# Patient Record
Sex: Female | Born: 2000 | Hispanic: Yes | Marital: Single | Smoking: Current every day smoker
Health system: Southern US, Community
[De-identification: ages and names within clinical notes are randomized; demographics above are authoritative.]

## PROBLEM LIST (undated history)

## (undated) DIAGNOSIS — J45909 Unspecified asthma, uncomplicated: Secondary | ICD-10-CM

## (undated) DIAGNOSIS — L83 Acanthosis nigricans: Secondary | ICD-10-CM

## (undated) DIAGNOSIS — E669 Obesity, unspecified: Secondary | ICD-10-CM

## (undated) DIAGNOSIS — G43909 Migraine, unspecified, not intractable, without status migrainosus: Secondary | ICD-10-CM

## (undated) DIAGNOSIS — T7840XA Allergy, unspecified, initial encounter: Secondary | ICD-10-CM

## (undated) DIAGNOSIS — J353 Hypertrophy of tonsils with hypertrophy of adenoids: Secondary | ICD-10-CM

---
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
## 2019-01-14 IMAGING — CT CT ABD-PELV W/ CM
2 of 4 series · 16 of 46 positions shown, 18 images · IV contrast (ISOVUE)
Comparison: None.

CLINICAL DATA: Abdominal pain and nausea

EXAM:
CT ABDOMEN AND PELVIS WITH CONTRAST
TECHNIQUE: Multidetector CT imaging of the abdomen and pelvis was performed
using the standard protocol following bolus administration of
intravenous contrast.
CONTRAST:  100mL [id number] IOPAMIDOL ([id number]) INJECTION 61%

[Series 2: axial st · axial · 0.75mm/px · z∈[+1080,+1480]mm · 13 of 92 slices shown, 15 images]
[im 6/92  soft-tissue]
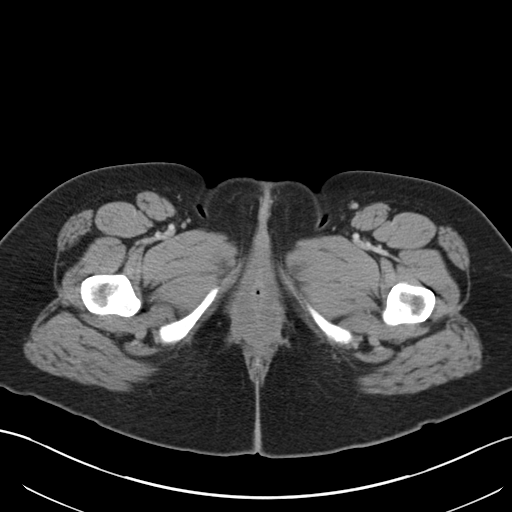
[im 6/92  bone]
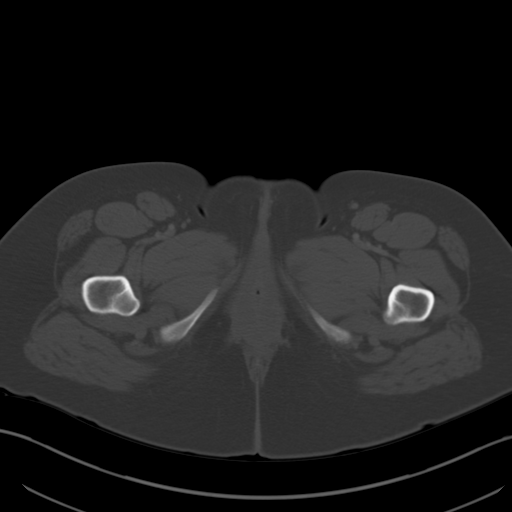
[im 11/92  soft-tissue]
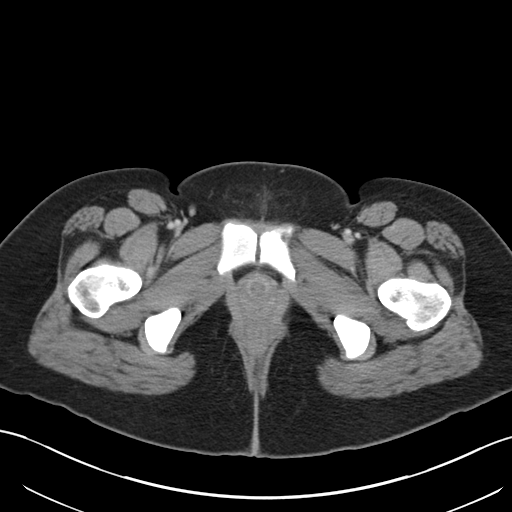
[im 21/92  soft-tissue]
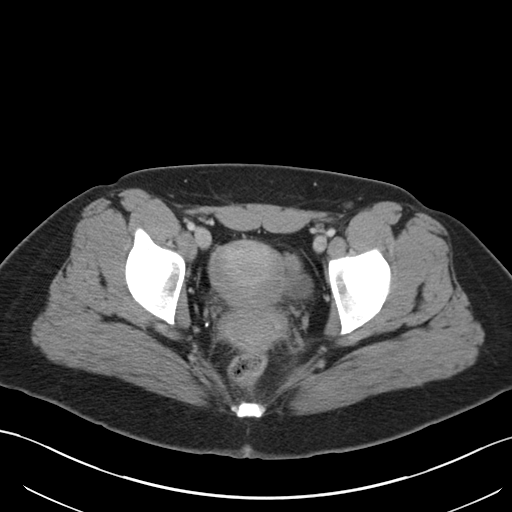
[im 26/92  soft-tissue]
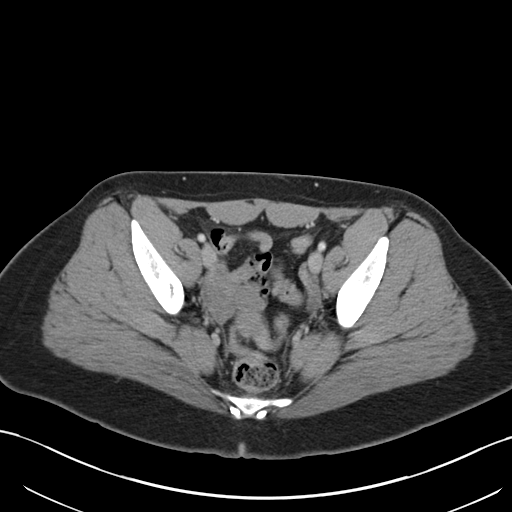
[im 31/92  soft-tissue]
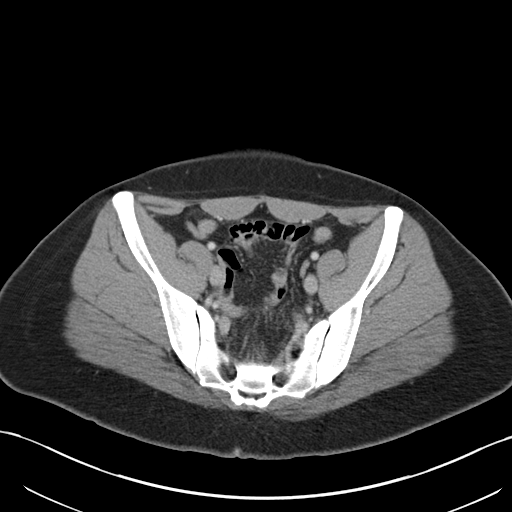
[im 41/92  soft-tissue]
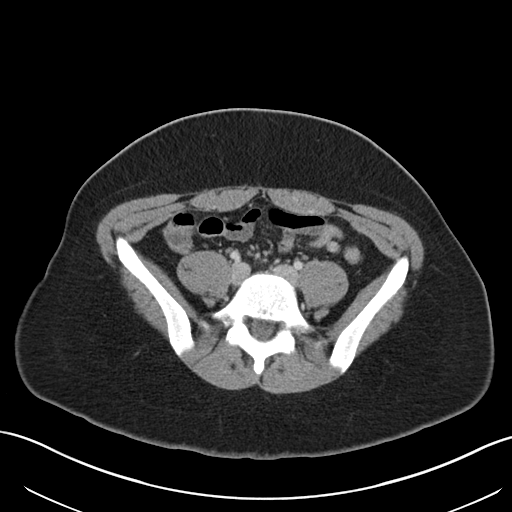
[im 46/92  soft-tissue]
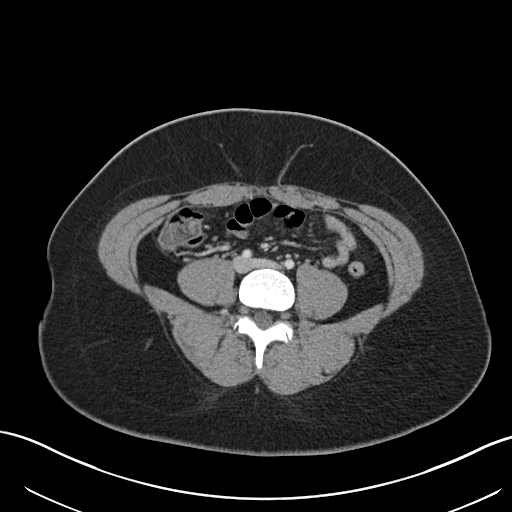
[im 51/92  soft-tissue]
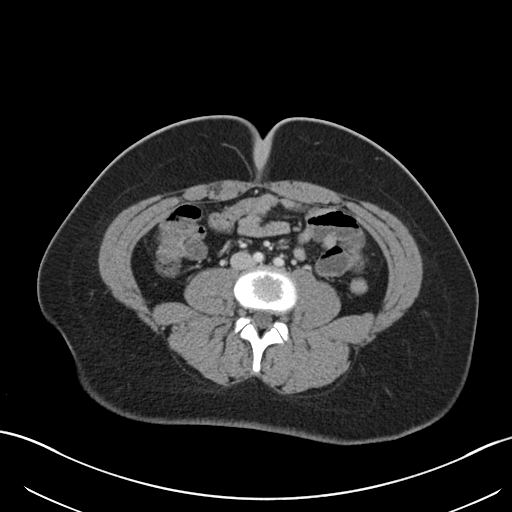
[im 61/92  soft-tissue]
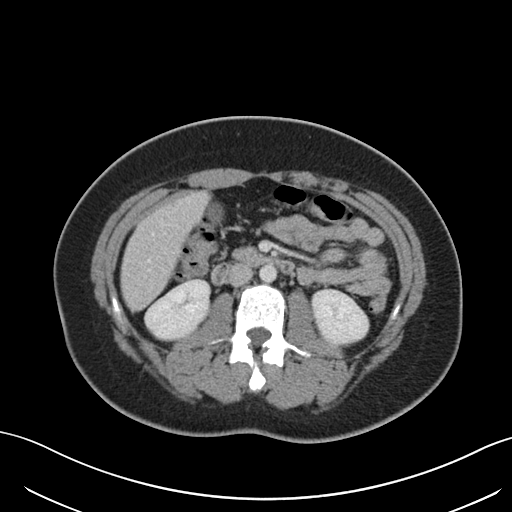
[im 61/92  bone]
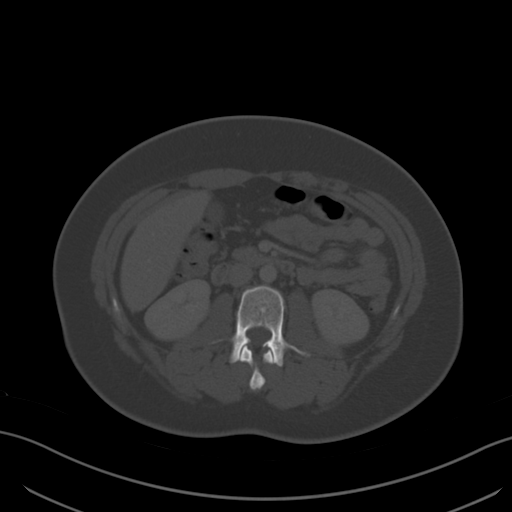
[im 66/92  soft-tissue]
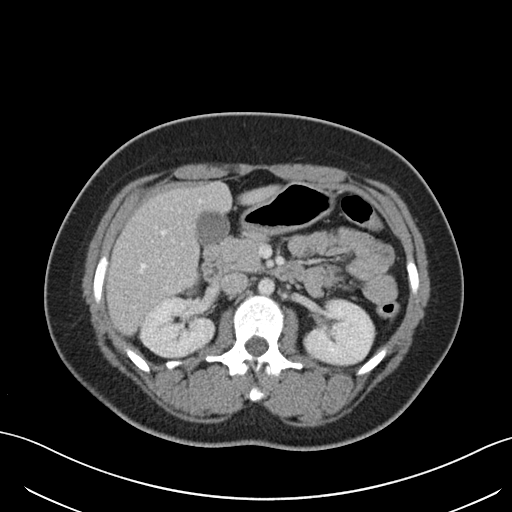
[im 71/92  soft-tissue]
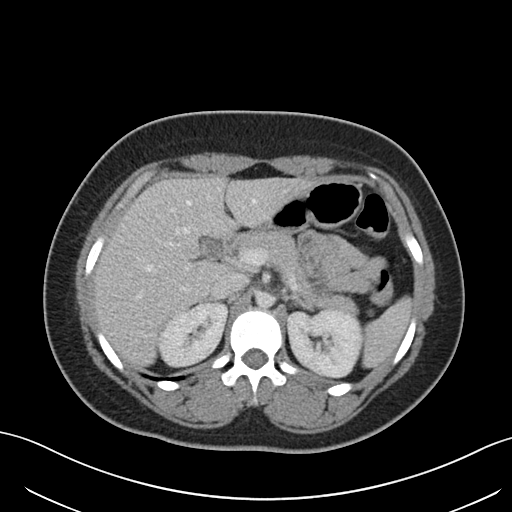
[im 81/92  soft-tissue]
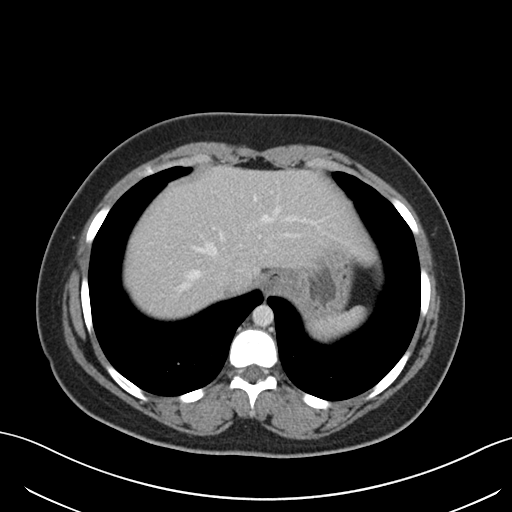
[im 86/92  soft-tissue]
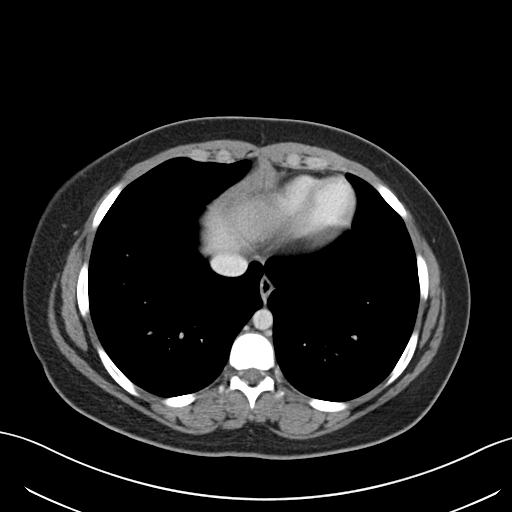

[Series 5: coronal st · coronal · 0.72mm/px · 3 of 90 slices shown]
[im 30/90  soft-tissue]
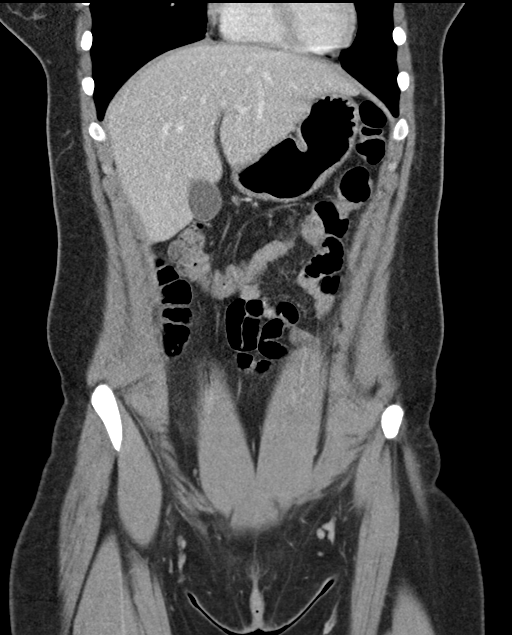
[im 40/90  soft-tissue]
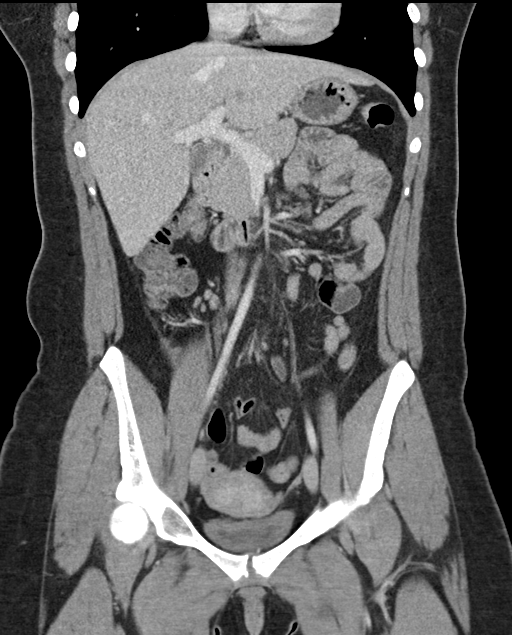
[im 50/90  soft-tissue]
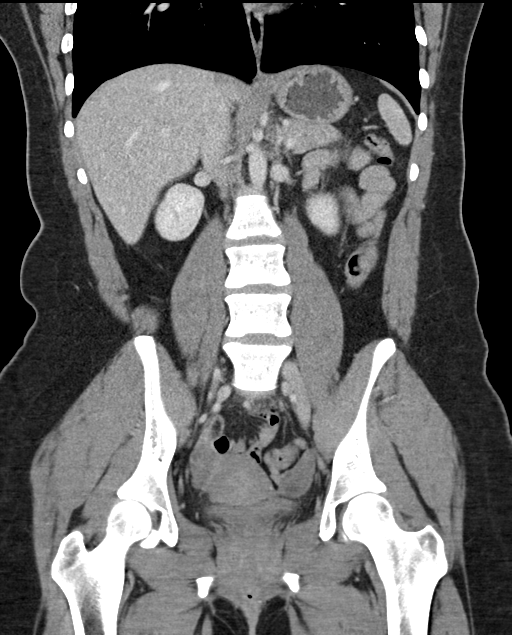

[16 of 46 positions shown; findings below may reference images not displayed]

FINDINGS: Lower chest: No basilar pulmonary nodules or pleural effusion. No
apical pericardial effusion.

Hepatobiliary: Normal hepatic contours and density. No visible
biliary dilatation. Normal gallbladder.

Pancreas: Normal parenchymal contours without ductal dilatation. No
peripancreatic fluid collection.

Spleen: Normal.

Adrenals/Urinary Tract:

--Adrenal glands: Normal.

--Right kidney/ureter: No hydronephrosis, perinephric stranding or
nephrolithiasis. No obstructing ureteral stones.

--Left kidney/ureter: No hydronephrosis, perinephric stranding or
nephrolithiasis. No obstructing ureteral stones.

--Urinary bladder: Normal appearance for the degree of distention.

Stomach/Bowel:

--Stomach/Duodenum: No hiatal hernia or other gastric abnormality.
Normal duodenal course.

--Small bowel: No dilatation or inflammation.

--Colon: No focal abnormality.

--Appendix: Normal.

Vascular/Lymphatic: Normal course and caliber of the major abdominal
vessels. No abdominal or pelvic lymphadenopathy.

Reproductive: No free fluid in the pelvis.

Musculoskeletal. No bony spinal canal stenosis or focal osseous
abnormality.

Other: None.
IMPRESSION: No acute abdominal or pelvic abnormality.

## 2019-06-27 ENCOUNTER — Other Ambulatory Visit: Payer: Self-pay

## 2019-06-27 ENCOUNTER — Emergency Department (HOSPITAL_COMMUNITY)
Admission: EM | Admit: 2019-06-27 | Discharge: 2019-06-27 | Disposition: A | Discharge status: Home / Self Care | Payer: Self-pay | Attending: Emergency Medicine | Admitting: Emergency Medicine

## 2019-06-27 ENCOUNTER — Emergency Department (HOSPITAL_COMMUNITY): Payer: Self-pay

## 2019-06-27 DIAGNOSIS — J4541 Moderate persistent asthma with (acute) exacerbation: Secondary | ICD-10-CM | POA: Insufficient documentation

## 2019-06-27 DIAGNOSIS — Z793 Long term (current) use of hormonal contraceptives: Secondary | ICD-10-CM | POA: Insufficient documentation

## 2019-06-27 DIAGNOSIS — F17210 Nicotine dependence, cigarettes, uncomplicated: Secondary | ICD-10-CM | POA: Insufficient documentation

## 2019-06-27 LAB — I-STAT BETA HCG BLOOD, ED (MC, WL, AP ONLY): I-stat hCG, quantitative: <5 m[IU]/mL (ref <5)

## 2019-06-27 LAB — CBC WITH DIFFERENTIAL/PLATELET
Abs Immature Granulocytes: 0.04 10*3/uL (ref 0.00–0.07)
Basophils Absolute: 0 10*3/uL (ref 0.0–0.1)
Basophils Relative: 0 %
Eosinophils Absolute: 0.2 10*3/uL (ref 0.0–0.5)
Eosinophils Relative: 2 %
HCT: 41.2 % (ref 36.0–46.0)
Hemoglobin: 14.4 g/dL (ref 12.0–15.0)
Immature Granulocytes: 0 %
Lymphocytes Relative: 40 %
Lymphs Abs: 3.9 10*3/uL (ref 0.7–4.0)
MCH: 27.6 pg (ref 26.0–34.0)
MCHC: 35 g/dL (ref 30.0–36.0)
MCV: 79.1 fL — ABNORMAL LOW (ref 80.0–100.0)
Monocytes Absolute: 0.7 10*3/uL (ref 0.1–1.0)
Monocytes Relative: 8 %
Neutro Abs: 4.8 10*3/uL (ref 1.7–7.7)
Neutrophils Relative %: 50 %
Platelets: 433 10*3/uL — ABNORMAL HIGH (ref 150–400)
RBC: 5.21 MIL/uL — ABNORMAL HIGH (ref 3.87–5.11)
RDW: 14 % (ref 11.5–15.5)
WBC: 9.6 10*3/uL (ref 4.0–10.5)
nRBC: 0 % (ref 0.0–0.2)

## 2019-06-27 LAB — BASIC METABOLIC PANEL
Anion gap: 10 (ref 5–15)
BUN: 12 mg/dL (ref 6–20)
CO2: 21 mmol/L — ABNORMAL LOW (ref 22–32)
Calcium: 9.4 mg/dL (ref 8.9–10.3)
Chloride: 108 mmol/L (ref 98–111)
Creatinine, Ser: 0.93 mg/dL (ref 0.44–1.00)
GFR calc Af Amer: >60 mL/min (ref >60)
GFR calc non Af Amer: >60 mL/min (ref >60)
Glucose, Bld: 119 mg/dL — ABNORMAL HIGH (ref 70–99)
Potassium: 3 mmol/L — ABNORMAL LOW (ref 3.5–5.1)
Sodium: 139 mmol/L (ref 135–145)

## 2019-06-27 MED ORDER — ALBUTEROL SULFATE HFA 108 (90 BASE) MCG/ACT IN AERS
6.0000 | INHALATION_SPRAY | Freq: Once | RESPIRATORY_TRACT | Status: AC
  Administered 2019-06-27: 09:00:00 2 via RESPIRATORY_TRACT
  Filled 2019-06-27: qty 6.7

## 2019-06-27 MED ORDER — PREDNISONE 10 MG PO TABS: 50.0000 mg | ORAL_TABLET | Freq: Every day | ORAL | 0 refills | Status: DC

## 2019-06-27 MED ORDER — ALBUTEROL SULFATE (2.5 MG/3ML) 0.083% IN NEBU
2.5000 mg | INHALATION_SOLUTION | Freq: Once | RESPIRATORY_TRACT | Status: AC
  Administered 2019-06-27: 09:00:00 2.5 mg via RESPIRATORY_TRACT
  Filled 2019-06-27: qty 3

## 2019-06-27 MED ORDER — ALBUTEROL SULFATE HFA 108 (90 BASE) MCG/ACT IN AERS
6.0000 | INHALATION_SPRAY | Freq: Once | RESPIRATORY_TRACT | Status: AC
  Administered 2019-06-27: 08:00:00 6 via RESPIRATORY_TRACT

## 2019-06-27 MED ORDER — LORAZEPAM 1 MG PO TABS
1.0000 mg | ORAL_TABLET | Freq: Once | ORAL | Status: AC
  Administered 2019-06-27: 1 mg via ORAL
  Filled 2019-06-27: qty 1

## 2019-06-27 MED ORDER — MAGNESIUM SULFATE 2 GM/50ML IV SOLN
2.0000 g | Freq: Once | INTRAVENOUS | Status: AC
  Administered 2019-06-27: 11:00:00 2 g via INTRAVENOUS
  Filled 2019-06-27: qty 50

## 2019-06-27 MED ORDER — ALBUTEROL SULFATE HFA 108 (90 BASE) MCG/ACT IN AERS
INHALATION_SPRAY | RESPIRATORY_TRACT | Status: AC
  Administered 2019-06-27: 6 via RESPIRATORY_TRACT
  Filled 2019-06-27: qty 6.7

## 2019-06-27 MED ORDER — POTASSIUM CHLORIDE CRYS ER 20 MEQ PO TBCR
40.0000 meq | EXTENDED_RELEASE_TABLET | Freq: Once | ORAL | Status: AC
  Administered 2019-06-27: 12:00:00 40 meq via ORAL
  Filled 2019-06-27: qty 2

## 2019-06-27 MED ORDER — ALBUTEROL SULFATE (2.5 MG/3ML) 0.083% IN NEBU
5.0000 mg | INHALATION_SOLUTION | RESPIRATORY_TRACT | Status: AC
  Administered 2019-06-27 (×2): 5 mg via RESPIRATORY_TRACT
  Filled 2019-06-27: qty 6

## 2019-06-27 MED ORDER — METHYLPREDNISOLONE SODIUM SUCC 125 MG IJ SOLR
125.0000 mg | Freq: Once | INTRAMUSCULAR | Status: AC
  Administered 2019-06-27: 11:00:00 125 mg via INTRAVENOUS
  Filled 2019-06-27: qty 2

## 2019-06-27 NOTE — ED Notes (Signed)
Provider at bedside requested wait for blood work and IV insertion.

## 2019-06-27 NOTE — ED Notes (Signed)
Pt verbalizes understanding of DC instructions. Belongings returned and ambulatory out of ED

## 2019-06-27 NOTE — ED Provider Notes (Signed)
[facility] DEPT Provider Note   CSN: [id number] Arrival date & time: 06/27/19  0865     History   Chief Complaint No chief complaint on file.   HPI Regina Lynn is a 18 y.o. female.     HPI  18 year old female with history of asthma comes in a chief complaint of shortness of breath. Patient reports that she has been having some shortness of breath over the past few days.  Today she woke up feeling like she could not breathe at all, and came to the ER immediately.  She reports that she has been taking albuterol treatment off and on with transient relief.  She has a cough, chest tightness.  She denies any fevers or chills, but states that she has had some body aches and feeling generalized weakness.  Patient smokes.  She denies any substance abuse besides marijuana use.  There is no history of heavy alcohol use.  Patient has not required admission to the hospital because of asthma.  She does indicate that during this time the year she typically gets asthma exacerbation because of weather change.  Pt has no hx of PE, DVT and denies any long distance travels or surgery in the past 6 weeks, active cancer, recent immobilization. She does use nexplanon implant.  Past Medical History:  Diagnosis Date  . Acanthosis nigricans   . Allergy   . Hypertrophy of tonsils and adenoids 11/2013   snores during sleep, mother denies apnea  . Migraines   . Obesity     There are no active problems to display for this patient.   Past Surgical History:  Procedure Laterality Date  . TONSILLECTOMY AND ADENOIDECTOMY Bilateral 12/19/2013   Procedure: BILATERAL TONSILLECTOMY AND ADENOIDECTOMY;  Surgeon: Ascencion Dike, MD;  Location: [place] [facility];  Service: ENT;  Laterality: Bilateral;     OB History   No obstetric history on file.      Home Medications    Prior to Admission medications   Medication Sig Start Date End Date Taking? Authorizing  Provider  albuterol (VENTOLIN HFA) 108 (90 Base) MCG/ACT inhaler Inhale 2 puffs into the lungs every 6 (six) hours as needed for wheezing or shortness of breath.   Yes [provider]  etonogestrel (NEXPLANON) 68 MG IMPL implant 1 each by Subdermal route once.   Yes [provider]  ibuprofen (ADVIL) 200 MG tablet Take 200 mg by mouth every 6 (six) hours as needed for fever, headache or moderate pain.   Yes [provider]  sodium chloride (OCEAN) 0.65 % SOLN nasal spray Place 1 spray into both nostrils as needed for congestion.   Yes [provider]  ondansetron (ZOFRAN) 4 MG tablet Take 1 tablet (4 mg total) by mouth every 8 (eight) hours as needed for nausea or vomiting. Patient not taking: Reported on 06/27/2019 12/03/17   Jillyn Ledger, PA-C    Family History Family History  Problem Relation Age of Onset  . Hypertension Father   . Asthma Father   . Hypertension Paternal Grandfather   . Asthma Paternal Grandfather     Social History Social History   Tobacco Use  . Smoking status: Current Every Day Smoker    Packs/day: 0.50    Types: Cigarettes  . Smokeless tobacco: Never Used  . Tobacco comment: mother smokes outside  Substance Use Topics  . Alcohol use: Yes    Comment: occasionally   . Drug use: Yes  Types: Marijuana    Comment: "molly"     Allergies   Patient has no known allergies.   Review of Systems Review of Systems  Constitutional: Positive for activity change.  Respiratory: Positive for cough and shortness of breath.   Cardiovascular: Positive for chest pain.  Gastrointestinal: Negative for nausea and vomiting.  Allergic/Immunologic: Negative for immunocompromised state.  Hematological: Does not bruise/bleed easily.  All other systems reviewed and are negative.    Physical Exam Updated Vital Signs BP 121/83   Pulse 60   Temp (!) 97.5 F (36.4 C) (Oral)   Resp 16   Ht 5\' 2"  (1.575 m)   Wt 77.1 kg   LMP  06/25/2019 (Exact Date)   SpO2 99%   BMI 31.09 kg/m   Physical Exam Vitals signs and nursing note reviewed.  Constitutional:      Appearance: She is well-developed.  HENT:     Head: Normocephalic and atraumatic.  Neck:     Musculoskeletal: Normal range of motion and neck supple.  Cardiovascular:     Rate and Rhythm: Normal rate.  Pulmonary:     Breath sounds: No rhonchi or rales.     Comments: Tachypnea, no hypoxia or respiratory distress. Patient has mild wheezing and diminished breath sounds diffusely Abdominal:     General: Bowel sounds are normal.  Skin:    General: Skin is warm and dry.  Neurological:     Mental Status: She is alert and oriented to person, place, and time.      ED Treatments / Results  Labs (all labs ordered are listed, but only abnormal results are displayed) Labs Reviewed  BASIC METABOLIC PANEL  CBC WITH DIFFERENTIAL/PLATELET  I-STAT BETA HCG BLOOD, ED ([facility], [facility], [facility] ONLY)    EKG EKG Interpretation  Date/Time:  Monday June 27 2019 07:39:09 EDT Ventricular Rate:  69 PR Interval:    QRS Duration: 85 QT Interval:  369 QTC Calculation: 396 R Axis:   80 Text Interpretation:  Sinus rhythm Probable left atrial enlargement No acute changes No significant change since last tracing Confirmed by Derwood Kaplananavati, Sussie Minor ([id number]) on 06/27/2019 8:43:11 AM   Radiology Dg Chest Port 1 View  Result Date: 06/27/2019 CLINICAL DATA:  Asthma.  Shortness of breath. EXAM: PORTABLE CHEST 1 VIEW COMPARISON:  December 02, 2017 FINDINGS: The heart size and mediastinal contours are within normal limits. Both lungs are clear. The visualized skeletal structures are unremarkable. IMPRESSION: No active disease. Electronically Signed   By: Gerome Samavid  Williams III M.D   On: 06/27/2019 08:09    Procedures .Critical Care Performed by: Derwood KaplanNanavati, Amanie Mcculley, MD Authorized by: Derwood KaplanNanavati, Janyla Biscoe, MD   Critical care provider statement:    Critical care time (minutes):  45   Critical care was  necessary to treat or prevent imminent or life-threatening deterioration of the following conditions:  Respiratory failure   Critical care was time spent personally by me on the following activities:  Discussions with consultants, evaluation of patient's response to treatment, examination of patient, ordering and performing treatments and interventions, ordering and review of laboratory studies, ordering and review of radiographic studies, pulse oximetry, re-evaluation of patient's condition, obtaining history from patient or surrogate and review of old charts   Smoking cessation instruction/counseling given:  counseled patient on the dangers of tobacco use, advised patient to stop smoking, and reviewed strategies to maximize success.  Advised to 800-quitnow. Discussed 3-5 min.   (including critical care time)  Medications Ordered in ED Medications  magnesium sulfate IVPB  2 g 50 mL (has no administration in time range)  albuterol (PROVENTIL) (2.5 MG/3ML) 0.083% nebulizer solution 5 mg (has no administration in time range)  methylPREDNISolone sodium succinate (SOLU-MEDROL) 125 mg/2 mL injection 125 mg (has no administration in time range)  albuterol (PROVENTIL) (2.5 MG/3ML) 0.083% nebulizer solution 2.5 mg (2.5 mg Nebulization Given 06/27/19 0901)  albuterol (VENTOLIN HFA) 108 (90 Base) MCG/ACT inhaler 6 puff (6 puffs Inhalation Given 06/27/19 0742)  albuterol (VENTOLIN HFA) 108 (90 Base) MCG/ACT inhaler 6 puff (2 puffs Inhalation Given 06/27/19 0857)  LORazepam (ATIVAN) tablet 1 mg (1 mg Oral Given 06/27/19 0856)     Initial Impression / Assessment and Plan / ED Course  I have reviewed the triage vital signs and the nursing notes.  Pertinent labs & imaging results that were available during my care of the patient were reviewed by me and considered in my medical decision making (see chart for details).  Clinical Course as of Jun 27 943  Mon Jun 27, 2019  0800 Patient reassessed after she  received multiple inhaler treatments.  She informs me that she feels better.  We will avoid any labs at this time.  X-ray pending.   [AN]  0930 Patient reassessed.  She reports that despite getting treatment, her respirations have gotten worse.  We will give her IV Solu-Medrol, magnesium and more breathing treatment and reassess.  RT performed a peak flow for [place] and informed me that her peak flow was only 160.  She still not in any acute respiratory distress or hypoxia.  We will reassess and decide if she needs admission.   [AN]    Clinical Course User Index [AN] Derwood Kaplan, MD       18 year old comes in a chief complaint of shortness of breath and difficulty in breathing. She reports history of asthma.  Patient has mild wheezing, diminished breath sounds and clinical concerns are that she is having asthma exacerbation.  She reports feeling more anxious as well therefore we will give her Ativan along with nebulizer treatment.    Final Clinical Impressions(s) / ED Diagnoses   Final diagnoses:  None    ED Discharge Orders    None       Derwood Kaplan, MD 06/27/19 [id number]

## 2019-06-27 NOTE — ED Notes (Signed)
Pt ambulated around room with O2 sats remaining above 95%

## 2019-06-27 NOTE — Discharge Instructions (Addendum)
We saw you in the ER for your asthma related complains. We gave you some breathing treatments in the ER, and seems like your symptoms have improved. Please take albuterol as needed every 4 hours. Please take the medications prescribed. Please refrain from smoking or smoke exposure. Please see a primary care doctor in 1 week. Return to the ER if your symptoms worsen.  

## 2019-06-27 NOTE — ED Notes (Signed)
Respiratory at bedside for nebulizer treatment

## 2019-06-27 NOTE — Progress Notes (Signed)
Peak flow = 160.

## 2019-09-05 ENCOUNTER — Emergency Department (HOSPITAL_COMMUNITY)
Admission: EM | Admit: 2019-09-05 | Discharge: 2019-09-05 | Discharge status: Against Medical Advice | Payer: Medicaid Other | Attending: Emergency Medicine | Admitting: Emergency Medicine

## 2019-09-05 ENCOUNTER — Encounter (HOSPITAL_COMMUNITY): Payer: Self-pay | Admitting: Emergency Medicine

## 2019-09-05 ENCOUNTER — Other Ambulatory Visit: Payer: Self-pay

## 2019-09-05 DIAGNOSIS — Z5321 Procedure and treatment not carried out due to patient leaving prior to being seen by health care provider: Secondary | ICD-10-CM | POA: Insufficient documentation

## 2019-09-05 DIAGNOSIS — M79651 Pain in right thigh: Secondary | ICD-10-CM | POA: Insufficient documentation

## 2019-09-05 NOTE — ED Triage Notes (Signed)
Pt has ingrown hair in her right upper thigh she has had lanced before. Pt report has been ongoing for 5 days

## 2019-09-06 NOTE — ED Provider Notes (Signed)
Patient left without being seen  1. Patient left without being seen       Deno Etienne, DO 09/06/19 1538

## 2020-03-08 ENCOUNTER — Emergency Department (HOSPITAL_COMMUNITY): Payer: Self-pay

## 2020-03-08 ENCOUNTER — Encounter (HOSPITAL_COMMUNITY): Payer: Self-pay | Admitting: Emergency Medicine

## 2020-03-08 ENCOUNTER — Emergency Department (HOSPITAL_COMMUNITY)
Admission: EM | Admit: 2020-03-08 | Discharge: 2020-03-08 | Disposition: A | Discharge status: Home / Self Care | Payer: Self-pay | Attending: Emergency Medicine | Admitting: Emergency Medicine

## 2020-03-08 ENCOUNTER — Other Ambulatory Visit: Payer: Self-pay

## 2020-03-08 DIAGNOSIS — R1115 Cyclical vomiting syndrome unrelated to migraine: Secondary | ICD-10-CM | POA: Insufficient documentation

## 2020-03-08 DIAGNOSIS — F17210 Nicotine dependence, cigarettes, uncomplicated: Secondary | ICD-10-CM | POA: Insufficient documentation

## 2020-03-08 DIAGNOSIS — R1084 Generalized abdominal pain: Secondary | ICD-10-CM | POA: Insufficient documentation

## 2020-03-08 DIAGNOSIS — R0789 Other chest pain: Secondary | ICD-10-CM | POA: Insufficient documentation

## 2020-03-08 DIAGNOSIS — R519 Headache, unspecified: Secondary | ICD-10-CM | POA: Insufficient documentation

## 2020-03-08 DIAGNOSIS — F1290 Cannabis use, unspecified, uncomplicated: Secondary | ICD-10-CM | POA: Insufficient documentation

## 2020-03-08 HISTORY — DX: Unspecified asthma, uncomplicated: J45.909

## 2020-03-08 LAB — CBC
HCT: 41.9 % (ref 36.0–46.0)
Hemoglobin: 14.5 g/dL (ref 12.0–15.0)
MCH: 27.5 pg (ref 26.0–34.0)
MCHC: 34.6 g/dL (ref 30.0–36.0)
MCV: 79.4 fL — ABNORMAL LOW (ref 80.0–100.0)
Platelets: 418 10*3/uL — ABNORMAL HIGH (ref 150–400)
RBC: 5.28 MIL/uL — ABNORMAL HIGH (ref 3.87–5.11)
RDW: 14 % (ref 11.5–15.5)
WBC: 7 10*3/uL (ref 4.0–10.5)
nRBC: 0 % (ref 0.0–0.2)

## 2020-03-08 LAB — COMPREHENSIVE METABOLIC PANEL
ALT: 18 U/L (ref 0–44)
AST: 15 U/L (ref 15–41)
Albumin: 4.6 g/dL (ref 3.5–5.0)
Alkaline Phosphatase: 28 U/L — ABNORMAL LOW (ref 38–126)
Anion gap: 10 (ref 5–15)
BUN: 9 mg/dL (ref 6–20)
CO2: 23 mmol/L (ref 22–32)
Calcium: 9 mg/dL (ref 8.9–10.3)
Chloride: 106 mmol/L (ref 98–111)
Creatinine, Ser: 0.84 mg/dL (ref 0.44–1.00)
GFR calc Af Amer: >60 mL/min (ref >60)
GFR calc non Af Amer: >60 mL/min (ref >60)
Glucose, Bld: 100 mg/dL — ABNORMAL HIGH (ref 70–99)
Potassium: 3.9 mmol/L (ref 3.5–5.1)
Sodium: 139 mmol/L (ref 135–145)
Total Bilirubin: 0.6 mg/dL (ref 0.3–1.2)
Total Protein: 7.7 g/dL (ref 6.5–8.1)

## 2020-03-08 LAB — URINALYSIS, ROUTINE W REFLEX MICROSCOPIC
Bilirubin Urine: NEGATIVE
Glucose, UA: NEGATIVE mg/dL
Ketones, ur: NEGATIVE mg/dL
Leukocytes,Ua: NEGATIVE
Nitrite: NEGATIVE
Protein, ur: NEGATIVE mg/dL
Specific Gravity, Urine: 1.012 (ref 1.005–1.030)
pH: 9 — ABNORMAL HIGH (ref 5.0–8.0)

## 2020-03-08 LAB — LIPASE, BLOOD: Lipase: 21 U/L (ref 11–51)

## 2020-03-08 LAB — I-STAT BETA HCG BLOOD, ED (MC, WL, AP ONLY): I-stat hCG, quantitative: <5 m[IU]/mL (ref <5)

## 2020-03-08 MED ORDER — ONDANSETRON 4 MG PO TBDP
4.0000 mg | ORAL_TABLET | Freq: Three times a day (TID) | ORAL | 0 refills | Status: DC | PRN
Start: 2020-03-08 — End: 2024-01-09

## 2020-03-08 MED ORDER — LACTATED RINGERS IV BOLUS
1000.0000 mL | Freq: Once | INTRAVENOUS | Status: AC
  Administered 2020-03-08: 1000 mL via INTRAVENOUS

## 2020-03-08 MED ORDER — METOCLOPRAMIDE HCL 5 MG/ML IJ SOLN
10.0000 mg | Freq: Once | INTRAMUSCULAR | Status: AC
  Administered 2020-03-08: 10 mg via INTRAVENOUS
  Filled 2020-03-08: qty 2

## 2020-03-08 MED ORDER — SODIUM CHLORIDE 0.9% FLUSH
3.0000 mL | Freq: Once | INTRAVENOUS | Status: AC
  Administered 2020-03-08: 3 mL via INTRAVENOUS

## 2020-03-08 MED ORDER — PANTOPRAZOLE SODIUM 40 MG PO TBEC
40.0000 mg | DELAYED_RELEASE_TABLET | Freq: Every day | ORAL | 0 refills | Status: DC
Start: 2020-03-08 — End: 2024-01-09

## 2020-03-08 NOTE — Discharge Instructions (Addendum)
If you develop worsening, continued, or recurrent abdominal pain, uncontrolled vomiting, fever, chest or back pain, or any other new/concerning symptoms then return to the ER for evaluation.   Stop using marijuana, as this may be causing your vomiting cycles. It is best to stop completely

## 2020-03-08 NOTE — ED Notes (Signed)
X-ray at bedside

## 2020-03-08 NOTE — ED Notes (Signed)
Pt requesting to have the IV removed from her arm at this time stating that the IV hurts. IV patent, IV flushes and pulls back. IV removed. MD aware.

## 2020-03-08 NOTE — ED Triage Notes (Signed)
Pt c/o abd pains with vomiting, headache, SOB-think asthma or anxiety. Pt used her dad's neb treatment this morning.

## 2020-03-08 NOTE — ED Provider Notes (Signed)
[facility]-EMERGENCY DEPT Provider Note   CSN: [id number] Arrival date & time: 03/08/20  0756     History Chief Complaint  Patient presents with  . Emesis  . Abdominal Pain  . Shortness of Breath    Regina Lynn is a 19 y.o. female.  HPI 19 year old female presents with a chief complaint of vomiting.  Started around 4 AM.  Has had about 3 episodes of nonbloody emesis.  After the vomiting started she then developed abdominal pain, then chest pain, and then headache.  The symptoms are similar to many prior episodes.  States she has been told it was from marijuana in the past but has had no further testing done.  Smokes daily.  Her symptoms are improving since original onset but she still has upper abdominal pain and nausea.  Brief shortness of breath that she thinks might have been from anxiety.  No cough.  No diarrhea.  No urinary or vaginal symptoms.   Past Medical History:  Diagnosis Date  . Acanthosis nigricans   . Allergy   . Asthma   . Hypertrophy of tonsils and adenoids 11/2013   snores during sleep, mother denies apnea  . Migraines   . Obesity     There are no problems to display for this patient.   Past Surgical History:  Procedure Laterality Date  . TONSILLECTOMY AND ADENOIDECTOMY Bilateral 12/19/2013   Procedure: BILATERAL TONSILLECTOMY AND ADENOIDECTOMY;  Surgeon: Darletta Moll, MD;  Location: [facility];  Service: ENT;  Laterality: Bilateral;     OB History   No obstetric history on file.     Family History  Problem Relation Age of Onset  . Hypertension Father   . Asthma Father   . Hypertension Paternal Grandfather   . Asthma Paternal Grandfather     Social History   Tobacco Use  . Smoking status: Current Every Day Smoker    Packs/day: 0.50    Types: Cigarettes  . Smokeless tobacco: Never Used  . Tobacco comment: mother smokes outside  Substance Use Topics  . Alcohol use: Yes    Comment: occasionally   .  Drug use: Yes    Types: Marijuana    Comment: "molly"    Home Medications Prior to Admission medications   Medication Sig Start Date End Date Taking? Authorizing Provider  etonogestrel (NEXPLANON) 68 MG IMPL implant 1 each by Subdermal route once.   Yes [provider]  fluticasone (FLONASE) 50 MCG/ACT nasal spray Place 2 sprays into both nostrils daily.   Yes [provider]  ibuprofen (ADVIL) 200 MG tablet Take 200 mg by mouth every 6 (six) hours as needed for fever, headache or moderate pain.   Yes [provider]  ondansetron (ZOFRAN ODT) 4 MG disintegrating tablet Take 1 tablet (4 mg total) by mouth every 8 (eight) hours as needed for nausea or vomiting. 03/08/20   Pricilla Loveless, MD  pantoprazole (PROTONIX) 40 MG tablet Take 1 tablet (40 mg total) by mouth daily. 03/08/20   Pricilla Loveless, MD    Allergies    Patient has no known allergies.  Review of Systems   Review of Systems  Constitutional: Negative for fever.  Respiratory: Negative for cough.   Cardiovascular: Positive for chest pain.  Gastrointestinal: Positive for abdominal pain, nausea and vomiting. Negative for diarrhea.  Genitourinary: Negative for dysuria, vaginal bleeding and vaginal discharge.  Neurological: Positive for headaches.  All other systems reviewed and are negative.   Physical  Exam Updated Vital Signs BP 120/81   Pulse 62   Temp 98.2 F (36.8 C) (Oral)   Resp (!) 21   SpO2 97%   Physical Exam Vitals and nursing note reviewed.  Constitutional:      General: She is not in acute distress.    Appearance: She is well-developed. She is not ill-appearing or diaphoretic.  HENT:     Head: Normocephalic and atraumatic.     Right Ear: External ear normal.     Left Ear: External ear normal.     Nose: Nose normal.  Eyes:     General:        Right eye: No discharge.        Left eye: No discharge.  Cardiovascular:     Rate and Rhythm: Normal rate and regular rhythm.      Heart sounds: Normal heart sounds.  Pulmonary:     Effort: Pulmonary effort is normal.     Breath sounds: Normal breath sounds.  Abdominal:     Palpations: Abdomen is soft.     Tenderness: There is generalized abdominal tenderness (mild).  Skin:    General: Skin is warm and dry.  Neurological:     Mental Status: She is alert.  Psychiatric:        Mood and Affect: Mood is not anxious.     ED Results / Procedures / Treatments   Labs (all labs ordered are listed, but only abnormal results are displayed) Labs Reviewed  COMPREHENSIVE METABOLIC PANEL - Abnormal; Notable for the following components:      Result Value   Glucose, Bld 100 (*)    Alkaline Phosphatase 28 (*)    All other components within normal limits  CBC - Abnormal; Notable for the following components:   RBC 5.28 (*)    MCV 79.4 (*)    Platelets 418 (*)    All other components within normal limits  URINALYSIS, ROUTINE W REFLEX MICROSCOPIC - Abnormal; Notable for the following components:   APPearance HAZY (*)    pH 9.0 (*)    Hgb urine dipstick SMALL (*)    Bacteria, UA RARE (*)    All other components within normal limits  LIPASE, BLOOD  I-STAT BETA HCG BLOOD, ED ([facility], [facility], [facility] ONLY)    EKG EKG Interpretation  Date/Time:  Thursday Mar 08 2020 09:57:53 EDT Ventricular Rate:  68 PR Interval:    QRS Duration: 90 QT Interval:  381 QTC Calculation: 406 R Axis:   64 Text Interpretation: Sinus rhythm no acute ST/T changes similar to Aug 2020 Confirmed by Sherwood Gambler [phone]) on 03/08/2020 9:59:54 AM   Radiology DG Chest Portable 1 View  Result Date: 03/08/2020 CLINICAL DATA:  Vomiting and chest pain EXAM: PORTABLE CHEST 1 VIEW COMPARISON:  06/27/2019 FINDINGS: Normal heart size and mediastinal contours. No acute infiltrate or edema. No effusion or pneumothorax. No acute osseous findings. IMPRESSION: No evidence of active disease. Electronically Signed   By: Monte Fantasia M.D.   On: 03/08/2020 10:26     Procedures Procedures (including critical care time)  Medications Ordered in ED Medications  sodium chloride flush (NS) 0.9 % injection 3 mL (3 mLs Intravenous Given 03/08/20 1019)  metoCLOPramide (REGLAN) injection 10 mg (10 mg Intravenous Given 03/08/20 1018)  lactated ringers bolus 1,000 mL (0 mLs Intravenous Stopped 03/08/20 1110)    ED Course  I have reviewed the triage vital signs and the nursing notes.  Pertinent labs & imaging results that were  available during my care of the patient were reviewed by me and considered in my medical decision making (see chart for details).    MDM Rules/Calculators/A&P                      Patient is feeling better after fluids and Reglan.  I have personally reviewed her labs which are overall benign and similar to prior.  Chest x-ray and ECG also personally reviewed and are unremarkable.  This appears to be a part of her cyclic vomiting, probably from daily marijuana use.  We discussed that stopping this completely would be the best way to see if this is the cause and hopefully treat her.  Will give Zofran as well for Rx.  Otherwise, she has some mild diffuse abdominal tenderness but my suspicion of acute emergent abdominal condition is pretty low.  Do not think imaging needed.  Discharge home with return precautions. Final Clinical Impression(s) / ED Diagnoses Final diagnoses:  Cyclical vomiting    Rx / DC Orders ED Discharge Orders         Ordered    Ambulatory referral to Gastroenterology     03/08/20 1121    pantoprazole (PROTONIX) 40 MG tablet  Daily     03/08/20 1121    ondansetron (ZOFRAN ODT) 4 MG disintegrating tablet  Every 8 hours PRN     03/08/20 1121           Pricilla Loveless, MD 03/08/20 1129

## 2020-06-28 ENCOUNTER — Other Ambulatory Visit: Payer: Self-pay

## 2020-06-28 ENCOUNTER — Other Ambulatory Visit (HOSPITAL_COMMUNITY)
Admission: RE | Admit: 2020-06-28 | Discharge: 2020-06-28 | Disposition: A | Discharge status: Home / Self Care | Payer: Medicaid Other | Source: Ambulatory Visit | Attending: Obstetrics and Gynecology | Admitting: Obstetrics and Gynecology

## 2020-06-28 ENCOUNTER — Ambulatory Visit (INDEPENDENT_AMBULATORY_CARE_PROVIDER_SITE_OTHER): Payer: Self-pay | Admitting: Obstetrics and Gynecology

## 2020-06-28 ENCOUNTER — Encounter: Payer: Self-pay | Admitting: Obstetrics and Gynecology

## 2020-06-28 ENCOUNTER — Ambulatory Visit: Payer: Self-pay | Admitting: *Deleted

## 2020-06-28 VITALS — BP 132/86 | Temp 97.3°F | Wt 191.1 lb

## 2020-06-28 DIAGNOSIS — R87810 Cervical high risk human papillomavirus (HPV) DNA test positive: Secondary | ICD-10-CM | POA: Insufficient documentation

## 2020-06-28 DIAGNOSIS — R87612 Low grade squamous intraepithelial lesion on cytologic smear of cervix (LGSIL): Secondary | ICD-10-CM | POA: Diagnosis not present

## 2020-06-28 DIAGNOSIS — R87611 Atypical squamous cells cannot exclude high grade squamous intraepithelial lesion on cytologic smear of cervix (ASC-H): Secondary | ICD-10-CM

## 2020-06-28 DIAGNOSIS — Z1239 Encounter for other screening for malignant neoplasm of breast: Secondary | ICD-10-CM

## 2020-06-28 DIAGNOSIS — R87610 Atypical squamous cells of undetermined significance on cytologic smear of cervix (ASC-US): Secondary | ICD-10-CM

## 2020-06-28 DIAGNOSIS — Z3202 Encounter for pregnancy test, result negative: Secondary | ICD-10-CM

## 2020-06-28 LAB — POCT PREGNANCY, URINE: Preg Test, Ur: NEGATIVE

## 2020-06-28 NOTE — Progress Notes (Signed)
Ms. Regina Lynn is a 19 y.o. female who presents to [facility] clinic today with no complaints.    Pap Smear: Pap not smear completed today. Last Pap smear was 01/10/2020 at [facility] and was ASC-H. Per patient her most recent Pap smear is the only abnormal Pap smear she has had. Last Pap smear result is available in Epic.   Physical exam: Breasts Breasts symmetrical. No skin abnormalities bilateral breasts. No nipple retraction bilateral breasts. No nipple discharge bilateral breasts. No lymphadenopathy. No lumps palpated bilateral breasts. No complaints of pain or tenderness on exam. Screening mammogram recommended at age 62 unless clinically indicated prior.       Pelvic/Bimanual Pap is not indicated today per BCCCP guidelines.    Smoking History: Patient is a current smoker. Discussed smoking cessation with patient. Referred to the [facility] Quitline and gave resources to the free smoking cessation classes at [facility].    Patient Navigation: Patient education provided. Access to services provided for patient through BCCCP program.    Breast and Cervical Cancer Risk Assessment: Patient does not have family history of breast cancer, known genetic mutations, or radiation treatment to the chest before age 43. Patient does not have history of cervical dysplasia, immunocompromised, or DES exposure in-utero. Breast cancer risk assessment completed. No breast cancer risk calculated due to patient is less than 32 years old.  Risk Assessment    Risk Scores      06/28/2020   Last edited by: Narda Rutherford, LPN   5-year risk:    Lifetime risk:          A: BCCCP exam without pap smear Patient referred to [facility] by [facility] due to having an abnormal Pap smear 01/10/2020 that a colposcopy is recommended for follow-up.  P: Referred patient to the [facility] for [facility] for a colposcopy to follow-up for her abnormal Pap smear. Appointment scheduled  Thursday, June 28, 2020 at 1315.  Priscille Heidelberg, RN 06/28/2020 2:06 PM

## 2020-06-28 NOTE — Patient Instructions (Signed)
Explained breast self awareness with Regina Lynn. Patient did not need a Pap smear today due to last Pap smear was 01/10/2020. Explained the colposcopy the recommended follow-up for her abnormal Pap smear. Referred patient to the [facility] for [facility] for a colposcopy to follow-up for her abnormal Pap smear. Appointment scheduled Thursday, June 28, 2020 at 1315. Patient aware of appointment and will be there. Let patient know a screening mammogram is recommended at age 65 unless clinically indicated prior. Discussed smoking cessation with patient. Referred to the [facility] Quitline and gave resources to the free smoking cessation classes at [facility]. Regina Lynn verbalized understanding.  Liana Camerer, Kathaleen Maser, RN 2:07 PM

## 2020-06-28 NOTE — Patient Instructions (Signed)
Colposcopía, cuidados posteriores °Colposcopy, Care After °Lea esta información sobre cómo cuidarse después del procedimiento. El médico también podrá darle indicaciones más específicas. Si tiene problemas o preguntas, llame al médico. °¿Qué puedo esperar después del procedimiento? °Si no le han extraído una muestra de tejido (no le realizaron una biopsia), es posible que solo presente unas manchas durante algunos días. Puede reanudar sus actividades habituales. °Si le extrajeron una muestra de tejido, es frecuente que tenga lo siguiente: °· Sensibilidad y dolor. Esto puede durar algunos días. °· Sensación de desvanecimiento. °· Sangrado leve de la vagina o secreción granulada de color oscuro de la vagina. Esto puede durar algunos días. Es posible que deba usar un apósito sanitario. °· Manchas durante al menos 48 horas después del procedimiento. °Siga estas indicaciones en su casa: ° °· Tome los medicamentos de venta libre y los recetados solamente como se lo haya indicado el médico. Pregúntele al médico qué medicamentos puede comenzar a tomar nuevamente. Esto es muy importante si toma anticoagulantes. °· No conduzca ni use maquinaria pesada mientras toma analgésicos recetados. °· Durante 3 días, o durante el tiempo que le indique el médico, evite lo siguiente: °? Las duchas vaginales. °? Los tampones. °? Las relaciones sexuales. °· Si usa un método anticonceptivo, continúe usándolo. °· Limite la actividad durante el primer día posterior al procedimiento. Pregúntele al médico qué actividades son seguras para usted. °· Es su responsabilidad retirar los resultados del procedimiento. Pregúntele al médico la fecha en que los resultados estarán disponibles. °· Concurra a todas las visitas de control como se lo haya indicado el médico. Esto es importante. °Comuníquese con un médico si: °· Aparece una erupción cutánea. °Solicite ayuda de inmediato si: °· Tiene mucho sangrado de la vagina. Mucho sangrado es si usa más de un  apósito sanitario por hora durante 2 horas seguidas. °· Elimina grumos de sangre (coágulos de sangre) por la vagina. °· Tiene fiebre. °· Tiene escalofríos. °· Tiene dolor en la parte baja del vientre (zona pélvica). °· Tiene signos de infección, como secreción vaginal que: °? Es diferente de la habitual. °? Es de color amarillo. °? Tiene mal olor. °· Tiene mucho dolor o cólicos abdominales en la parte baja del vientre que no se alivian con medicamentos. °· Tiene sensación de desvanecimiento. °· Siente mareos. °· Pierde el conocimiento (se desmaya). °Resumen °· Si no le han extraído una muestra de tejido (no le realizaron una biopsia), es posible que solo presente unas manchas durante algunos días. Puede reanudar sus actividades habituales. °· Si le extrajeron una muestra de tejido, es frecuente tener dolor leve y manchas durante 48 horas. °· Durante 3 días, o durante el tiempo que le indique el médico, evite las duchas vaginales, los tampones y las relaciones sexuales. °· Busque ayuda de inmediato si presenta sangrado, mucho dolor intenso o signos de infección. °Esta información no tiene como fin reemplazar el consejo del médico. Asegúrese de hacerle al médico cualquier pregunta que tenga. °Document Revised: 05/22/2017 Document Reviewed: 05/12/2013 °Elsevier Patient Education © 2020 Elsevier Inc. ° °

## 2020-06-28 NOTE — Progress Notes (Signed)
    GYNECOLOGY CLINIC COLPOSCOPY PROCEDURE NOTE  19 y.o. No obstetric history on file. here for colposcopy for ASC cannot exclude high grade lesion [facility]) pap smear on 3/21. Discussed role for HPV in cervical dysplasia, need for surveillance.  Patient given informed consent, signed copy in the chart, time out was performed.  Placed in lithotomy position. Cervix viewed with speculum and colposcope after application of acetic acid.   Colposcopy adequate? Yes  acetowhite lesion(s) noted at 12 & 6  o'clock; corresponding biopsies obtained.  ECC specimen obtained. Monsel's applied All specimens were labelled and sent to pathology.   Patient was given post procedure instructions.  Will follow up pathology and manage accordingly.  Routine preventative health maintenance measures emphasized.    Nettie Elm, MD, FACOG Attending Obstetrician & Gynecologist Center for [facility], [facility]

## 2020-07-03 LAB — SURGICAL PATHOLOGY

## 2020-07-05 ENCOUNTER — Encounter: Payer: Self-pay | Admitting: General Practice

## 2020-08-21 ENCOUNTER — Encounter: Payer: Self-pay | Admitting: Family Medicine

## 2020-08-29 ENCOUNTER — Telehealth: Payer: Self-pay | Admitting: Lactation Services

## 2020-08-29 NOTE — Telephone Encounter (Signed)
Called patient and informed her of Colposcopy results and need for follow up Pap Smear in 1 year Reviewed patient needs to call in July to reschedule. She had scheduled a follow up for November. Advised her she does not need follow up until next year unless she has concerns. Patient voiced understanding.

## 2020-09-24 ENCOUNTER — Ambulatory Visit: Payer: Medicaid Other | Admitting: Obstetrics and Gynecology

## 2021-07-18 ENCOUNTER — Ambulatory Visit: Payer: Medicaid Other

## 2022-07-12 ENCOUNTER — Other Ambulatory Visit: Payer: Self-pay

## 2022-07-12 ENCOUNTER — Emergency Department (HOSPITAL_COMMUNITY)
Admission: EM | Admit: 2022-07-12 | Discharge: 2022-07-12 | Disposition: A | Discharge status: Home / Self Care | Payer: Medicaid Other | Attending: Emergency Medicine | Admitting: Emergency Medicine

## 2022-07-12 ENCOUNTER — Encounter (HOSPITAL_COMMUNITY): Payer: Self-pay

## 2022-07-12 DIAGNOSIS — Z7951 Long term (current) use of inhaled steroids: Secondary | ICD-10-CM | POA: Insufficient documentation

## 2022-07-12 DIAGNOSIS — J45901 Unspecified asthma with (acute) exacerbation: Secondary | ICD-10-CM | POA: Insufficient documentation

## 2022-07-12 MED ORDER — ALBUTEROL SULFATE HFA 108 (90 BASE) MCG/ACT IN AERS: 2.0000 | INHALATION_SPRAY | RESPIRATORY_TRACT | Status: DC | PRN

## 2022-07-12 MED ORDER — DEXAMETHASONE SODIUM PHOSPHATE 10 MG/ML IJ SOLN
10.0000 mg | Freq: Once | INTRAMUSCULAR | Status: AC
  Administered 2022-07-12: 10 mg via INTRAMUSCULAR
  Filled 2022-07-12: qty 1

## 2022-07-12 MED ORDER — FLUTICASONE PROPIONATE 50 MCG/ACT NA SUSP: 2.0000 | Freq: Every day | NASAL | 0 refills | Status: DC

## 2022-07-12 MED ORDER — IPRATROPIUM-ALBUTEROL 0.5-2.5 (3) MG/3ML IN SOLN
3.0000 mL | Freq: Once | RESPIRATORY_TRACT | Status: AC
  Administered 2022-07-12: 3 mL via RESPIRATORY_TRACT
  Filled 2022-07-12: qty 3

## 2022-07-12 MED ORDER — STERILE WATER FOR INJECTION IJ SOLN
INTRAMUSCULAR | Status: AC
  Filled 2022-07-12: qty 10

## 2022-07-12 MED ORDER — ALBUTEROL SULFATE HFA 108 (90 BASE) MCG/ACT IN AERS
1.0000 | INHALATION_SPRAY | Freq: Once | RESPIRATORY_TRACT | Status: AC
  Administered 2022-07-12: 1 via RESPIRATORY_TRACT
  Filled 2022-07-12: qty 6.7

## 2022-07-12 MED ORDER — METHYLPREDNISOLONE SODIUM SUCC 125 MG IJ SOLR
125.0000 mg | Freq: Once | INTRAMUSCULAR | Status: DC
  Filled 2022-07-12: qty 2

## 2022-07-12 NOTE — ED Provider Notes (Signed)
[facility] DEPT Provider Note   CSN: [id number] Arrival date & time: 07/12/22  1055     History  Chief Complaint  Patient presents with   Asthma    Regina Lynn is a 21 y.o. female.   Asthma     Patient with medical history of allergy, asthma, migraine presents today due to asthma exacerbation.  Patient has been feeling sick the past few days with nasal congestion which she attributed to allergies, she is taking her allergy medicine but has not had significant improvement yet.  Starting this morning she has been exceptionally short of breath, having a nonproductive cough.  States she has tried her rescue inhaler with 4 puffs today and has 2 albuterol nebulizer treatments prior to arrival but is still short of breath.  There is minimal improvement.  She feels like her chest is tight because she is not able to get a good breath, denies any exquisite chest pain.  Home Medications Prior to Admission medications   Medication Sig Start Date End Date Taking? Authorizing Provider  etonogestrel (NEXPLANON) 68 MG IMPL implant 1 each by Subdermal route once.    [provider]  fluticasone (FLONASE) 50 MCG/ACT nasal spray Place 2 sprays into both nostrils daily. 07/12/22   Sherrill Raring, PA-C  ibuprofen (ADVIL) 200 MG tablet Take 200 mg by mouth every 6 (six) hours as needed for fever, headache or moderate pain. Patient not taking: Reported on 06/28/2020    [provider]  ondansetron (ZOFRAN ODT) 4 MG disintegrating tablet Take 1 tablet (4 mg total) by mouth every 8 (eight) hours as needed for nausea or vomiting. Patient not taking: Reported on 06/28/2020 03/08/20   Sherwood Gambler, MD  pantoprazole (PROTONIX) 40 MG tablet Take 1 tablet (40 mg total) by mouth daily. 03/08/20   Sherwood Gambler, MD      Allergies    Patient has no known allergies.    Review of Systems   Review of Systems  Physical Exam Updated Vital Signs BP 129/88 (BP  Location: Right Arm)   Pulse 98   Temp 98 F (36.7 C) (Oral)   Resp 16   Ht 5\' 2"  (1.575 m)   Wt 83.9 kg   SpO2 98%   BMI 33.84 kg/m  Physical Exam Vitals and nursing note reviewed. Exam conducted with a chaperone present.  Constitutional:      Appearance: Normal appearance.  HENT:     Head: Normocephalic and atraumatic.  Eyes:     General: No scleral icterus.       Right eye: No discharge.        Left eye: No discharge.     Extraocular Movements: Extraocular movements intact.     Pupils: Pupils are equal, round, and reactive to light.  Cardiovascular:     Rate and Rhythm: Normal rate and regular rhythm.     Pulses: Normal pulses.     Heart sounds: Normal heart sounds. No murmur heard.    No friction rub. No gallop.  Pulmonary:     Effort: Pulmonary effort is normal. Tachypnea present. No respiratory distress.     Breath sounds: Wheezing present.  Abdominal:     General: Abdomen is flat. Bowel sounds are normal. There is no distension.     Palpations: Abdomen is soft.     Tenderness: There is no abdominal tenderness.  Skin:    General: Skin is warm and dry.     Coloration: Skin is not jaundiced.  Neurological:     Mental Status: She is alert. Mental status is at baseline.     Coordination: Coordination normal.     ED Results / Procedures / Treatments   Labs (all labs ordered are listed, but only abnormal results are displayed) Labs Reviewed - No data to display  EKG EKG Interpretation  Date/Time:  Saturday July 12 2022 11:00:05 EDT Ventricular Rate:  92 PR Interval:  139 QRS Duration: 109 QT Interval:  353 QTC Calculation: 437 R Axis:   72 Text Interpretation: Sinus rhythm Low voltage, precordial leads No significant change since last tracing Confirmed by Jacalyn Lefevre [phone]) on 07/12/2022 11:04:59 AM  Radiology No results found.  Procedures Procedures    Medications Ordered in ED Medications  sterile water (preservative free) injection (   Not Given 07/12/22 1217)  ipratropium-albuterol (DUONEB) 0.5-2.5 (3) MG/3ML nebulizer solution 3 mL (3 mLs Nebulization Given 07/12/22 1159)  dexamethasone (DECADRON) injection 10 mg (10 mg Intramuscular Given 07/12/22 1214)  ipratropium-albuterol (DUONEB) 0.5-2.5 (3) MG/3ML nebulizer solution 3 mL (3 mLs Nebulization Given 07/12/22 1211)  albuterol (VENTOLIN HFA) 108 (90 Base) MCG/ACT inhaler 1 puff (1 puff Inhalation Given 07/12/22 1257)    ED Course/ Medical Decision Making/ A&P Clinical Course as of 07/12/22 1635  Sat Jul 12, 2022  1245 I reevaluated patient's lungs, after 2 DuoNebs she is no longer wheezing and feels significantly improved.  And Decadron also appears to help alleviate symptoms.  Patient is requesting albuterol inhaler refill which we will provide. [HS]    Clinical Course User Index [HS] Theron Arista, PA-C                           Medical Decision Making Risk Prescription drug management.   Patient presents due to shortness of breath.  Differential includes but not limited to asthma exacerbation, PE, pneumonia, COVID, pneumothorax.  On exam patient has diffuse wheezing to the lower lobes and is mildly tachypneic but not in respiratory distress.  Consistent with asthma exacerbation, she is mildly tachycardic at 106 but with regular rhythm.  Afebrile, does not appear septic and is not hypoxic. -BP (!) 131/95 (BP Location: Left Arm)   Pulse (!) 106   Temp 98.2 F (36.8 C) (Axillary)   Resp 13   Ht 5\' 2"  (1.575 m)   Wt 83.9 kg   SpO2 100%   BMI 33.84 kg/m   I ordered an EKG which shows sinus rhythm without any new ischemic changes, consistent with previous EKGs without changes.  Patient is on cardiac monitoring is in sinus rhythm on my reevaluation the room with a heart rate of 94.  I ordered DuoNeb's x2, Decadron IM 10 mg.  On reevaluation the wheezing is improved.  Patient is feeling significantly improved.  Initially of course considered hospitalization but given  patient's significant improvement I do not think negative.  She is not having any chest pain, I think the pain was secondary to getting complete breath and I do not think it is related to ACS, consider PE but also think less likely in the setting of asthma exacerbation.        Final Clinical Impression(s) / ED Diagnoses Final diagnoses:  Moderate asthma with exacerbation, unspecified whether persistent    Rx / DC Orders ED Discharge Orders          Ordered    fluticasone (FLONASE) 50 MCG/ACT nasal spray  Daily  07/12/22 1246              Theron Arista, PA-C 07/12/22 1635    Jacalyn Lefevre, MD 07/13/22 [phone]

## 2022-07-12 NOTE — Discharge Instructions (Signed)
You are seen today in the emergency department for an asthma exacerbation.  The steroid to be used in the next 2 days not reduce inflammation.  Continue taking montelukast, also take cetirizine and use the Flonase spray in each nose daily to help with the allergies.  Follow-up with your primary next week for continued symptoms, return to the ED if you start having worsening shortness of breath or if you have any chest pain that recurs.

## 2022-07-12 NOTE — ED Triage Notes (Addendum)
Patient reports that she is having an asthma flare up. Patient states she has had 2 albuterol neb treatments and 4 puffs of her Albuterol inhaler today and continues to feel SOB. Sats-100% in triage. Patient also reports that she is having chest pain when she takes a deep breath.

## 2022-12-08 ENCOUNTER — Emergency Department (HOSPITAL_COMMUNITY)
Admission: EM | Admit: 2022-12-08 | Discharge: 2022-12-08 | Disposition: A | Discharge status: Home / Self Care | Payer: Self-pay | Attending: Emergency Medicine | Admitting: Emergency Medicine

## 2022-12-08 ENCOUNTER — Encounter (HOSPITAL_COMMUNITY): Payer: Self-pay | Admitting: Emergency Medicine

## 2022-12-08 ENCOUNTER — Emergency Department (HOSPITAL_COMMUNITY): Payer: Medicaid Other

## 2022-12-08 DIAGNOSIS — F419 Anxiety disorder, unspecified: Secondary | ICD-10-CM | POA: Insufficient documentation

## 2022-12-08 DIAGNOSIS — Y990 Civilian activity done for income or pay: Secondary | ICD-10-CM | POA: Insufficient documentation

## 2022-12-08 DIAGNOSIS — S60221A Contusion of right hand, initial encounter: Secondary | ICD-10-CM | POA: Insufficient documentation

## 2022-12-08 DIAGNOSIS — S60511A Abrasion of right hand, initial encounter: Secondary | ICD-10-CM

## 2022-12-08 DIAGNOSIS — W268XXA Contact with other sharp object(s), not elsewhere classified, initial encounter: Secondary | ICD-10-CM | POA: Insufficient documentation

## 2022-12-08 MED ORDER — AMOXICILLIN-POT CLAVULANATE 875-125 MG PO TABS: 1.0000 | ORAL_TABLET | Freq: Two times a day (BID) | ORAL | 0 refills | Status: AC

## 2022-12-08 NOTE — ED Provider Notes (Signed)
[facility] Provider Note   CSN: [id number] Arrival date & time: 12/08/22  [id number]     History  Chief Complaint  Patient presents with   Hand Pain    Regina Lynn is a 22 y.o. female.  Pt reports she was bitten by a cat a month ago.  Pt reports she also hit and cut her hand at work 2 weeks ago.   Pt reports she is having issues with anxiety   The history is provided by the patient. No language interpreter was used.  Hand Pain This is a new problem. The problem occurs constantly.       Home Medications Prior to Admission medications   Medication Sig Start Date End Date Taking? Authorizing Provider  amoxicillin-clavulanate (AUGMENTIN) 875-125 MG tablet Take 1 tablet by mouth 2 (two) times daily. 12/08/22  Yes Fransico Meadow, PA-C  etonogestrel (NEXPLANON) 68 MG IMPL implant 1 each by Subdermal route once.    [provider]  fluticasone (FLONASE) 50 MCG/ACT nasal spray Place 2 sprays into both nostrils daily. 07/12/22   Sherrill Raring, PA-C  ibuprofen (ADVIL) 200 MG tablet Take 200 mg by mouth every 6 (six) hours as needed for fever, headache or moderate pain. Patient not taking: Reported on 06/28/2020    [provider]  ondansetron (ZOFRAN ODT) 4 MG disintegrating tablet Take 1 tablet (4 mg total) by mouth every 8 (eight) hours as needed for nausea or vomiting. Patient not taking: Reported on 06/28/2020 03/08/20   Sherwood Gambler, MD  pantoprazole (PROTONIX) 40 MG tablet Take 1 tablet (40 mg total) by mouth daily. 03/08/20   Sherwood Gambler, MD      Allergies    Patient has no known allergies.    Review of Systems   Review of Systems  All other systems reviewed and are negative.   Physical Exam Updated Vital Signs BP 119/87 (BP Location: Left Arm)   Pulse 87   Temp 97.9 F (36.6 C) (Oral)   Resp 18   LMP 12/01/2022   SpO2 99%  Physical Exam Vitals reviewed.  Constitutional:      Appearance: Normal  appearance.  Cardiovascular:     Rate and Rhythm: Normal rate.  Pulmonary:     Effort: Pulmonary effort is normal.  Musculoskeletal:        General: Swelling and tenderness present.     Comments: Healed abrasion right dorsal hand,  slight swelling 5th metacarpal area , no sign of infection  nv and ns intact   Skin:    General: Skin is warm.  Neurological:     General: No focal deficit present.     Mental Status: She is alert.  Psychiatric:        Mood and Affect: Mood normal.     ED Results / Procedures / Treatments   Labs (all labs ordered are listed, but only abnormal results are displayed) Labs Reviewed - No data to display  EKG None  Radiology DG Hand Complete Right  Result Date: 12/08/2022 CLINICAL DATA:  Patient was attacked by her CT a while ago pain at second metacarpophalangeal joint EXAM: RIGHT HAND - COMPLETE 3+ VIEW COMPARISON:  None Available. FINDINGS: There is no evidence of fracture or dislocation. There is no radiopaque foreign body. There is no evidence of arthropathy or other focal bone abnormality. Soft tissues swelling about the second metacarpophalangeal joint. Evaluation of soft tissue abscess is limited on radiographs, further evaluation with cross-sectional  imaging is recommended if there is clinical concern for abscess. IMPRESSION: 1. No acute fracture or dislocation. 2. Soft tissue swelling about the second metacarpophalangeal joint. Evaluation of soft tissue abscess is limited on radiographs, further evaluation with cross-sectional imaging is recommended if there is clinical concern for abscess. Electronically Signed   By: Keane Police D.O.   On: 12/08/2022 10:21    Procedures Procedures    Medications Ordered in ED Medications - No data to display  ED Course/ Medical Decision Making/ A&P                             Medical Decision Making Pt complains of hand pain   Amount and/or Complexity of Data Reviewed Radiology:  ordered.  Risk Prescription drug management. Risk Details: Pt given rx for augmentin.            Final Clinical Impression(s) / ED Diagnoses Final diagnoses:  Contusion of right hand, initial encounter  Abrasion of right hand, initial encounter    Rx / DC Orders ED Discharge Orders          Ordered    amoxicillin-clavulanate (AUGMENTIN) 875-125 MG tablet  2 times daily        12/08/22 1041           An After Visit Summary was printed and given to the patient.    Fransico Meadow, [place] 12/08/22 [place], DO 12/09/22 [id number]

## 2022-12-08 NOTE — ED Triage Notes (Signed)
Pt reports R hand pain and weakness. States she has cut her hand recently and got scratched by cat.

## 2022-12-08 NOTE — Discharge Instructions (Signed)
Follow up with Orthopaedist for recheck in 2-3 days

## 2023-01-08 ENCOUNTER — Encounter (HOSPITAL_COMMUNITY): Payer: Self-pay

## 2023-01-08 ENCOUNTER — Other Ambulatory Visit: Payer: Self-pay

## 2023-01-08 ENCOUNTER — Emergency Department (HOSPITAL_COMMUNITY)
Admission: EM | Admit: 2023-01-08 | Discharge: 2023-01-08 | Disposition: A | Discharge status: Home / Self Care | Payer: BC Managed Care – PPO | Attending: Emergency Medicine | Admitting: Emergency Medicine

## 2023-01-08 DIAGNOSIS — R42 Dizziness and giddiness: Secondary | ICD-10-CM | POA: Diagnosis not present

## 2023-01-08 DIAGNOSIS — N3000 Acute cystitis without hematuria: Secondary | ICD-10-CM | POA: Insufficient documentation

## 2023-01-08 DIAGNOSIS — R110 Nausea: Secondary | ICD-10-CM | POA: Diagnosis present

## 2023-01-08 DIAGNOSIS — R519 Headache, unspecified: Secondary | ICD-10-CM | POA: Diagnosis not present

## 2023-01-08 DIAGNOSIS — F419 Anxiety disorder, unspecified: Secondary | ICD-10-CM | POA: Diagnosis not present

## 2023-01-08 LAB — CBC WITH DIFFERENTIAL/PLATELET
Abs Immature Granulocytes: 0 10*3/uL (ref 0.00–0.07)
Basophils Absolute: 0.1 10*3/uL (ref 0.0–0.1)
Basophils Relative: 1 %
Eosinophils Absolute: 0.3 10*3/uL (ref 0.0–0.5)
Eosinophils Relative: 5 %
HCT: 39.8 % (ref 36.0–46.0)
Hemoglobin: 14.2 g/dL (ref 12.0–15.0)
Immature Granulocytes: 0 %
Lymphocytes Relative: 53 %
Lymphs Abs: 3.3 10*3/uL (ref 0.7–4.0)
MCH: 27.9 pg (ref 26.0–34.0)
MCHC: 35.7 g/dL (ref 30.0–36.0)
MCV: 78.2 fL — ABNORMAL LOW (ref 80.0–100.0)
Monocytes Absolute: 0.4 10*3/uL (ref 0.1–1.0)
Monocytes Relative: 6 %
Neutro Abs: 2.2 10*3/uL (ref 1.7–7.7)
Neutrophils Relative %: 35 %
Platelets: 404 10*3/uL — ABNORMAL HIGH (ref 150–400)
RBC: 5.09 MIL/uL (ref 3.87–5.11)
RDW: 13.7 % (ref 11.5–15.5)
WBC: 6.3 10*3/uL (ref 4.0–10.5)
nRBC: 0 % (ref 0.0–0.2)

## 2023-01-08 LAB — BASIC METABOLIC PANEL
Anion gap: 9 (ref 5–15)
BUN: 7 mg/dL (ref 6–20)
CO2: 21 mmol/L — ABNORMAL LOW (ref 22–32)
Calcium: 9.4 mg/dL (ref 8.9–10.3)
Chloride: 105 mmol/L (ref 98–111)
Creatinine, Ser: 0.93 mg/dL (ref 0.44–1.00)
GFR, Estimated: >60 mL/min (ref >60)
Glucose, Bld: 88 mg/dL (ref 70–99)
Potassium: 4.1 mmol/L (ref 3.5–5.1)
Sodium: 135 mmol/L (ref 135–145)

## 2023-01-08 LAB — URINALYSIS, ROUTINE W REFLEX MICROSCOPIC
Bilirubin Urine: NEGATIVE
Glucose, UA: NEGATIVE mg/dL
Ketones, ur: 5 mg/dL — AB
Nitrite: POSITIVE — AB
Protein, ur: 30 mg/dL — AB
Specific Gravity, Urine: 1.02 (ref 1.005–1.030)
WBC, UA: >50 WBC/hpf (ref 0–5)
pH: 6 (ref 5.0–8.0)

## 2023-01-08 LAB — PREGNANCY, URINE: Preg Test, Ur: NEGATIVE

## 2023-01-08 MED ORDER — CEPHALEXIN 250 MG PO CAPS
500.0000 mg | ORAL_CAPSULE | Freq: Once | ORAL | Status: AC
  Administered 2023-01-08: 500 mg via ORAL
  Filled 2023-01-08: qty 2

## 2023-01-08 MED ORDER — CEPHALEXIN 500 MG PO CAPS: 500.0000 mg | ORAL_CAPSULE | Freq: Two times a day (BID) | ORAL | 0 refills | Status: DC

## 2023-01-08 MED ORDER — CEPHALEXIN 500 MG PO CAPS: 500.0000 mg | ORAL_CAPSULE | Freq: Two times a day (BID) | ORAL | 0 refills | Status: AC

## 2023-01-08 NOTE — ED Triage Notes (Signed)
Pt came in via POV d/t for the past 2 days feeling "off" more specifically she has been having high BP (per pt), HA, lightheadedness, dizzy spells & this happens intermittently. A/Ox4, reports HA pain 5/10 while in triage. Also endorses Hx of anxiety.

## 2023-01-08 NOTE — ED Provider Triage Note (Signed)
Emergency Medicine Provider Triage Evaluation Note  Regina Lynn , a 22 y.o. female  was evaluated in triage.  Patient presenting with intermittent dizzy and lightheaded spells over the past couple of days.  She says that it is worse when she turns her head from side-to-side.  Also says that she thinks it might be secondary to hypertension but she does not take any medication for this.  Also tells me she has a history of anxiety and that it could be contributing to this as well.  No visual disturbance.  Review of Systems  Positive:  Negative:  Physical Exam  BP (!) 135/104 (BP Location: Right Arm)   Pulse 89   Temp 98.7 F (37.1 C)   Resp 16   SpO2 97%  Gen:   Awake, no distress   Resp:  Normal effort  MSK:   Moves extremities without difficulty  Other:  Cranial nerves II through XII grossly intact.  No problem with finger-nose testing.  Medical Decision Making  Medically screening exam initiated at 2:31 PM.  Appropriate orders placed.  JAMILE WASKO was informed that the remainder of the evaluation will be completed by another provider, this initial triage assessment does not replace that evaluation, and the importance of remaining in the ED until their evaluation is complete.     Rhae Hammock, PA-C 01/08/23 1431

## 2023-01-08 NOTE — ED Provider Notes (Signed)
[facility] Provider Note   CSN: [id number] Arrival date & time: 01/08/23  1410     History  Chief Complaint  Patient presents with   Anxiety   Headache   Dizziness    Regina Lynn is a 22 y.o. female.   Anxiety Associated symptoms include headaches.  Headache Associated symptoms: dizziness   Dizziness Associated symptoms: headaches   This patient is a 22 year old female, she states that she has a history of some anxiety but otherwise no significant chronic medical conditions.  She presents to the hospital within a couple of days of developing some lightheadedness, dizziness, mild nausea and feeling like something is just not quite right.  She cannot tell me exactly what her symptoms are otherwise.  She has no specific abdominal pain, no specific chest pain, she states that sometimes she feels a heaviness in her chest when she gets anxious.  No fevers or chills, no diarrhea     Home Medications Prior to Admission medications   Medication Sig Start Date End Date Taking? Authorizing Provider  amoxicillin-clavulanate (AUGMENTIN) 875-125 MG tablet Take 1 tablet by mouth 2 (two) times daily. 12/08/22   Fransico Meadow, PA-C  cephALEXin (KEFLEX) 500 MG capsule Take 1 capsule (500 mg total) by mouth 2 (two) times daily for 7 days. 01/08/23 01/15/23  Noemi Chapel, MD  etonogestrel (NEXPLANON) 68 MG IMPL implant 1 each by Subdermal route once.    [provider]  fluticasone (FLONASE) 50 MCG/ACT nasal spray Place 2 sprays into both nostrils daily. 07/12/22   Sherrill Raring, PA-C  ibuprofen (ADVIL) 200 MG tablet Take 200 mg by mouth every 6 (six) hours as needed for fever, headache or moderate pain. Patient not taking: Reported on 06/28/2020    [provider]  ondansetron (ZOFRAN ODT) 4 MG disintegrating tablet Take 1 tablet (4 mg total) by mouth every 8 (eight) hours as needed for nausea or vomiting. Patient not taking:  Reported on 06/28/2020 03/08/20   Sherwood Gambler, MD  pantoprazole (PROTONIX) 40 MG tablet Take 1 tablet (40 mg total) by mouth daily. 03/08/20   Sherwood Gambler, MD      Allergies    Patient has no known allergies.    Review of Systems   Review of Systems  Neurological:  Positive for dizziness and headaches.  All other systems reviewed and are negative.   Physical Exam Updated Vital Signs BP (!) 138/95 (BP Location: Right Arm)   Pulse 80   Temp 98.8 F (37.1 C) (Oral)   Resp 18   SpO2 100%  Physical Exam Vitals and nursing note reviewed.  Constitutional:      General: She is not in acute distress.    Appearance: She is well-developed.  HENT:     Head: Normocephalic and atraumatic.     Mouth/Throat:     Pharynx: No oropharyngeal exudate.  Eyes:     General: No scleral icterus.       Right eye: No discharge.        Left eye: No discharge.     Conjunctiva/sclera: Conjunctivae normal.     Pupils: Pupils are equal, round, and reactive to light.  Neck:     Thyroid: No thyromegaly.     Vascular: No JVD.  Cardiovascular:     Rate and Rhythm: Normal rate and regular rhythm.     Heart sounds: Normal heart sounds. No murmur heard.    No friction rub. No gallop.  Pulmonary:     Effort: Pulmonary effort is normal. No respiratory distress.     Breath sounds: Normal breath sounds. No wheezing or rales.  Abdominal:     General: Bowel sounds are normal. There is no distension.     Palpations: Abdomen is soft. There is no mass.     Tenderness: There is no abdominal tenderness.  Musculoskeletal:        General: No tenderness. Normal range of motion.     Cervical back: Normal range of motion and neck supple.  Lymphadenopathy:     Cervical: No cervical adenopathy.  Skin:    General: Skin is warm and dry.     Findings: No erythema or rash.  Neurological:     Mental Status: She is alert.     Coordination: Coordination normal.  Psychiatric:        Behavior: Behavior normal.      ED Results / Procedures / Treatments   Labs (all labs ordered are listed, but only abnormal results are displayed) Labs Reviewed  CBC WITH DIFFERENTIAL/PLATELET - Abnormal; Notable for the following components:      Result Value   MCV 78.2 (*)    Platelets 404 (*)    All other components within normal limits  BASIC METABOLIC PANEL - Abnormal; Notable for the following components:   CO2 21 (*)    All other components within normal limits  URINALYSIS, ROUTINE W REFLEX MICROSCOPIC - Abnormal; Notable for the following components:   APPearance CLOUDY (*)    Hgb urine dipstick MODERATE (*)    Ketones, ur 5 (*)    Protein, ur 30 (*)    Nitrite POSITIVE (*)    Leukocytes,Ua LARGE (*)    Bacteria, UA FEW (*)    All other components within normal limits  PREGNANCY, URINE  GC/CHLAMYDIA PROBE AMP ([place]) NOT AT [facility]    EKG None  Radiology No results found.  Procedures Procedures    Medications Ordered in ED Medications  cephALEXin (KEFLEX) capsule 500 mg (500 mg Oral Given 01/08/23 1909)    ED Course/ Medical Decision Making/ A&P                             Medical Decision Making Amount and/or Complexity of Data Reviewed Labs: ordered.  Risk Prescription drug management.   This patient presents to the ED for concern of mild vague symptoms including nausea and lightheadedness, this involves an extensive number of treatment options, and is a complaint that carries with it a high risk of complications and morbidity.  The differential diagnosis includes electrolyte abnormalities, anemia, infection, medication side effects   Co morbidities that complicate the patient evaluation  History of cyclic vomiting, history of recent evaluation in the ER for contusion of hand, placed on Augmentin about a month ago   Additional history obtained:  Additional history obtained from electronic medical record External records from outside source obtained and reviewed  including prior office visits, ER visits, prior imaging   Lab Tests:  I Ordered, and personally interpreted labs.  The pertinent results include: Metabolic panel and CBC unremarkable, urinalysis with likely infection, no vaginal discharge, not pregnant   Cardiac Monitoring: / EKG:  The patient was maintained on a cardiac monitor.  I personally viewed and interpreted the cardiac monitored which showed an underlying rhythm of: Normal sinus rhythm   Problem List / ED Course / Critical interventions / Medication management  Patient benign, given cephalexin for UTI, no other acute findings I ordered medication including cephalexin for infection Reevaluation of the patient after these medicines showed that the patient stable I have reviewed the patients home medicines and have made adjustments as needed   Social Determinants of Health:  None   Test / Admission - Considered:  Considered CT scan but patient has no abdominal tenderness on exam, urinalysis consistent with UTI, stable for discharge         Final Clinical Impression(s) / ED Diagnoses Final diagnoses:  Acute cystitis without hematuria    Rx / DC Orders ED Discharge Orders          Ordered    cephALEXin (KEFLEX) 500 MG capsule  2 times daily,   Status:  Discontinued        01/08/23 1908    cephALEXin (KEFLEX) 500 MG capsule  2 times daily        01/08/23 2041              Noemi Chapel, MD 01/09/23 1446

## 2023-01-08 NOTE — Discharge Instructions (Signed)
Your testing shows that you have a urinary tract infection, please take the medication called cephalexin twice a day for the next 7 days, ER for worsening symptoms, see your doctor in 3 days if no better

## 2023-01-09 LAB — GC/CHLAMYDIA PROBE AMP (~~LOC~~) NOT AT ARMC
Chlamydia: NEGATIVE
Comment: NEGATIVE
Comment: NORMAL
Neisseria Gonorrhea: NEGATIVE

## 2023-05-27 ENCOUNTER — Emergency Department (HOSPITAL_COMMUNITY)
Admission: EM | Admit: 2023-05-27 | Discharge: 2023-05-27 | Disposition: A | Discharge status: Home / Self Care | Payer: BLUE CROSS/BLUE SHIELD | Attending: Emergency Medicine | Admitting: Emergency Medicine

## 2023-05-27 ENCOUNTER — Other Ambulatory Visit: Payer: Self-pay

## 2023-05-27 ENCOUNTER — Encounter (HOSPITAL_COMMUNITY): Payer: Self-pay

## 2023-05-27 DIAGNOSIS — L02214 Cutaneous abscess of groin: Secondary | ICD-10-CM | POA: Diagnosis present

## 2023-05-27 MED ORDER — DOXYCYCLINE HYCLATE 100 MG PO TABS
100.0000 mg | ORAL_TABLET | Freq: Once | ORAL | Status: AC
  Administered 2023-05-27: 100 mg via ORAL
  Filled 2023-05-27: qty 1

## 2023-05-27 MED ORDER — OXYCODONE-ACETAMINOPHEN 5-325 MG PO TABS
2.0000 | ORAL_TABLET | Freq: Once | ORAL | Status: AC
  Administered 2023-05-27: 2 via ORAL
  Filled 2023-05-27: qty 2

## 2023-05-27 MED ORDER — LIDOCAINE HCL 2 % IJ SOLN
10.0000 mL | Freq: Once | INTRAMUSCULAR | Status: AC
  Administered 2023-05-27: 200 mg
  Filled 2023-05-27: qty 20

## 2023-05-27 MED ORDER — OXYCODONE-ACETAMINOPHEN 5-325 MG PO TABS: 1.0000 | ORAL_TABLET | Freq: Four times a day (QID) | ORAL | 0 refills | Status: DC | PRN

## 2023-05-27 MED ORDER — DOXYCYCLINE HYCLATE 100 MG PO CAPS: 100.0000 mg | ORAL_CAPSULE | Freq: Two times a day (BID) | ORAL | 0 refills | Status: AC

## 2023-05-27 NOTE — ED Provider Notes (Signed)
[place] EMERGENCY DEPARTMENT AT [facility] Provider Note   CSN: [id number] Arrival date & time: 05/27/23  1444     History  Chief Complaint  Patient presents with   Abscess    Regina Lynn is a 22 y.o. female history of abscess here presenting with concern for another abscess.  Patient had previous right perineal abscess that required I&D.  Patient states that she noticed pain on the left inguinal area.  She states that she is concerned that she has another abscess.  Patient denies any drainage.  Denies any fevers.  Patient states that she is not sexually active currently and denies any vaginal discharge.  She denies any history of MRSA and is not diabetic.  The history is provided by the patient.       Home Medications Prior to Admission medications   Medication Sig Start Date End Date Taking? Authorizing Provider  amoxicillin-clavulanate (AUGMENTIN) 875-125 MG tablet Take 1 tablet by mouth 2 (two) times daily. 12/08/22   Elson Areas, PA-C  etonogestrel (NEXPLANON) 68 MG IMPL implant 1 each by Subdermal route once.    [provider]  fluticasone (FLONASE) 50 MCG/ACT nasal spray Place 2 sprays into both nostrils daily. 07/12/22   Theron Arista, PA-C  ibuprofen (ADVIL) 200 MG tablet Take 200 mg by mouth every 6 (six) hours as needed for fever, headache or moderate pain. Patient not taking: Reported on 06/28/2020    [provider]  ondansetron (ZOFRAN ODT) 4 MG disintegrating tablet Take 1 tablet (4 mg total) by mouth every 8 (eight) hours as needed for nausea or vomiting. Patient not taking: Reported on 06/28/2020 03/08/20   Pricilla Loveless, MD  pantoprazole (PROTONIX) 40 MG tablet Take 1 tablet (40 mg total) by mouth daily. 03/08/20   Pricilla Loveless, MD      Allergies    Patient has no known allergies.    Review of Systems   Review of Systems  Skin:  Positive for color change.  All other systems reviewed and are negative.   Physical  Exam Updated Vital Signs BP (!) 134/100 (BP Location: Right Arm)   Pulse (!) 118   Temp 99.6 F (37.6 C) (Oral)   Resp 18   Ht 5\' 2"  (1.575 m)   Wt 83.9 kg   SpO2 99%   BMI 33.84 kg/m  Physical Exam Vitals and nursing note reviewed.  Constitutional:      Comments: Uncomfortable   HENT:     Head: Normocephalic.     Nose: Nose normal.     Mouth/Throat:     Mouth: Mucous membranes are moist.  Eyes:     Extraocular Movements: Extraocular movements intact.     Pupils: Pupils are equal, round, and reactive to light.  Cardiovascular:     Rate and Rhythm: Normal rate and regular rhythm.     Heart sounds: Normal heart sounds.  Pulmonary:     Effort: Pulmonary effort is normal.     Breath sounds: Normal breath sounds.  Abdominal:     General: Abdomen is flat.     Palpations: Abdomen is soft.  Genitourinary:    Comments: Patient has tender area in the left perineum.  There is fluctuance and surrounding cellulitis.  It is not involving the vagina or the rectum. Musculoskeletal:     Cervical back: Normal range of motion and neck supple.  Skin:    Capillary Refill: Capillary refill takes less than 2 seconds.  Neurological:  General: No focal deficit present.     Mental Status: She is alert.  Psychiatric:        Mood and Affect: Mood normal.     ED Results / Procedures / Treatments   Labs (all labs ordered are listed, but only abnormal results are displayed) Labs Reviewed - No data to display  EKG None  Radiology No results found.  Procedures Procedures    INCISION AND DRAINAGE Performed by: Richardean Canal Consent: Verbal consent obtained. Risks and benefits: risks, benefits and alternatives were discussed Type: abscess  Body area: L inguinal area   Anesthesia: local infiltration  Incision was made with a scalpel.  Local anesthetic: lidocaine 2% no epinephrine  Anesthetic total: 20 ml  Complexity: complex Blunt dissection to break up  loculations  Drainage: purulent  Drainage amount: copious   Packing material: none  Patient tolerance: Patient tolerated the procedure well with no immediate complications.    Medications Ordered in ED Medications  oxyCODONE-acetaminophen (PERCOCET/ROXICET) 5-325 MG per tablet 2 tablet (has no administration in time range)  lidocaine (XYLOCAINE) 2 % (with pres) injection 200 mg (has no administration in time range)  doxycycline (VIBRA-TABS) tablet 100 mg (has no administration in time range)    ED Course/ Medical Decision Making/ A&P                                 Medical Decision Making Regina Lynn is a 22 y.o. female here presenting with left perineal abscess.  Will perform I&D and will give doxycycline to treat MRSA and skin flora.    6:26 PM ID performed and copious amounts came out.  Will discharge home with pain medicine and doxycycline.  Recommend wound check in 2 days.  Problems Addressed: Inguinal abscess: acute illness or injury  Risk Prescription drug management.    Final Clinical Impression(s) / ED Diagnoses Final diagnoses:  None    Rx / DC Orders ED Discharge Orders     None         Charlynne Pander, MD 05/27/23 1827

## 2023-05-27 NOTE — ED Triage Notes (Signed)
Patient reports abscess on left side of perineum x 1 week. Reports increased pain. Denies fevers.

## 2023-05-27 NOTE — Discharge Instructions (Signed)
You have a skin abscess that was drained in the ED.  You are expected to have more drainage.  I recommend you change the dressing when it is soaked and please try warm soaks to help it continue to drain  I recommend you take Tylenol or Motrin for pain and Percocet for severe pain  Take doxycycline 100 mg twice daily for a week  Wound check in 48 hours.  Return to ER if you have fever or severe pain

## 2023-10-01 ENCOUNTER — Emergency Department (HOSPITAL_COMMUNITY)
Admission: EM | Admit: 2023-10-01 | Discharge: 2023-10-01 | Disposition: A | Discharge status: Home / Self Care | Payer: BLUE CROSS/BLUE SHIELD | Attending: Emergency Medicine | Admitting: Emergency Medicine

## 2023-10-01 ENCOUNTER — Other Ambulatory Visit: Payer: Self-pay

## 2023-10-01 ENCOUNTER — Encounter (HOSPITAL_COMMUNITY): Payer: Self-pay

## 2023-10-01 DIAGNOSIS — B9689 Other specified bacterial agents as the cause of diseases classified elsewhere: Secondary | ICD-10-CM | POA: Insufficient documentation

## 2023-10-01 DIAGNOSIS — N760 Acute vaginitis: Secondary | ICD-10-CM | POA: Insufficient documentation

## 2023-10-01 DIAGNOSIS — N939 Abnormal uterine and vaginal bleeding, unspecified: Secondary | ICD-10-CM | POA: Insufficient documentation

## 2023-10-01 LAB — CBC
HCT: 40.6 % (ref 36.0–46.0)
Hemoglobin: 14.3 g/dL (ref 12.0–15.0)
MCH: 28 pg (ref 26.0–34.0)
MCHC: 35.2 g/dL (ref 30.0–36.0)
MCV: 79.6 fL — ABNORMAL LOW (ref 80.0–100.0)
Platelets: 411 10*3/uL — ABNORMAL HIGH (ref 150–400)
RBC: 5.1 MIL/uL (ref 3.87–5.11)
RDW: 13.9 % (ref 11.5–15.5)
WBC: 7.5 10*3/uL (ref 4.0–10.5)
nRBC: 0 % (ref 0.0–0.2)

## 2023-10-01 LAB — WET PREP, GENITAL
Sperm: NONE SEEN
Trich, Wet Prep: NONE SEEN
WBC, Wet Prep HPF POC: <10 (ref <10)
Yeast Wet Prep HPF POC: NONE SEEN

## 2023-10-01 LAB — HIV ANTIBODY (ROUTINE TESTING W REFLEX): HIV Screen 4th Generation wRfx: NONREACTIVE

## 2023-10-01 LAB — HCG, SERUM, QUALITATIVE: Preg, Serum: NEGATIVE

## 2023-10-01 MED ORDER — METRONIDAZOLE 500 MG PO TABS: 500.0000 mg | ORAL_TABLET | Freq: Two times a day (BID) | ORAL | 0 refills | Status: AC

## 2023-10-01 MED ORDER — METRONIDAZOLE 500 MG PO TABS
500.0000 mg | ORAL_TABLET | Freq: Once | ORAL | Status: AC
  Administered 2023-10-01: 500 mg via ORAL
  Filled 2023-10-01: qty 1

## 2023-10-01 MED ORDER — MEGESTROL ACETATE 40 MG PO TABS: ORAL_TABLET | ORAL | 0 refills | Status: AC

## 2023-10-01 NOTE — ED Triage Notes (Signed)
Patient is here for evaluation of excessive vaginal bleeding. Reports last menstrual cycle November 8-16. Reports she started having more vaginal bleeding on November 28 and is still currently bleeding. Reports pain in the lower pelvic area.

## 2023-10-01 NOTE — Discharge Instructions (Signed)
You were seen in the emergency department for your abnormal vaginal bleeding.  Your pregnancy test was negative and you have no signs of severe anemia.  You did test positive for bacterial vaginosis with may have induced some abnormal bleeding.  I given you prescription of antibiotics he should complete this as prescribed.  If you are continuing to have bleeding despite the antibiotics I have also given you a prescription of Megace that you can take to help stop the bleeding.  You can follow-up with your primary doctor or with your OB/GYN to have your symptoms rechecked.  You will get a call if any of your other STD panel comes back positive.  You should return to the emergency department for significant heavy bleeding where you are going through more than a pad an hour, you become lightheaded and pass out or any other new or concerning symptoms.

## 2023-10-01 NOTE — ED Provider Notes (Signed)
[facility] EMERGENCY DEPARTMENT AT [facility] Provider Note   CSN: [id number] Arrival date & time: 10/01/23  1511     History  Chief Complaint  Patient presents with   Vaginal Bleeding    Regina Lynn is a 22 y.o. female.  Patient is a 22 year old female with Nexplanon in place presenting to the emergency department with abnormal uterine bleeding.  Patient states that she had a normal.  Earlier in November and then her period restarted on the 28th and she has had bleeding since then.  She states that she has not have normal cramps.  She states that she does not use pads or tampons and just uses tissues but has had to change out the tissues multiple times since being in the emergency department today.  She states that she feels a little bit lightheaded and weak.  She states that prior to the bleeding starting she did have intercourse and is unsure if she could have had a tear.  She states that she would like to be tested for STDs today but has had no abnormal discharge.  The history is provided by the patient.  Vaginal Bleeding      Home Medications Prior to Admission medications   Medication Sig Start Date End Date Taking? Authorizing Provider  megestrol (MEGACE) 40 MG tablet Take 3 tablets (120 mg total) by mouth daily for 5 days, THEN 2 tablets (80 mg total) daily for 5 days, THEN 1 tablet (40 mg total) daily for 20 days. 10/01/23 10/31/23 Yes Theresia Lo, Benetta Spar K, DO  metroNIDAZOLE (FLAGYL) 500 MG tablet Take 1 tablet (500 mg total) by mouth 2 (two) times daily. 10/01/23  Yes Theresia Lo, Benetta Spar K, DO  amoxicillin-clavulanate (AUGMENTIN) 875-125 MG tablet Take 1 tablet by mouth 2 (two) times daily. 12/08/22   Elson Areas, PA-C  doxycycline (VIBRAMYCIN) 100 MG capsule Take 1 capsule (100 mg total) by mouth 2 (two) times daily. One po bid x 7 days 05/27/23   Charlynne Pander, MD  etonogestrel (NEXPLANON) 68 MG IMPL implant 1 each by Subdermal route once.    [provider]  fluticasone (FLONASE) 50 MCG/ACT nasal spray Place 2 sprays into both nostrils daily. 07/12/22   Theron Arista, PA-C  ibuprofen (ADVIL) 200 MG tablet Take 200 mg by mouth every 6 (six) hours as needed for fever, headache or moderate pain. Patient not taking: Reported on 06/28/2020    [provider]  ondansetron (ZOFRAN ODT) 4 MG disintegrating tablet Take 1 tablet (4 mg total) by mouth every 8 (eight) hours as needed for nausea or vomiting. Patient not taking: Reported on 06/28/2020 03/08/20   Pricilla Loveless, MD  oxyCODONE-acetaminophen (PERCOCET) 5-325 MG tablet Take 1 tablet by mouth every 6 (six) hours as needed. 05/27/23   Charlynne Pander, MD  pantoprazole (PROTONIX) 40 MG tablet Take 1 tablet (40 mg total) by mouth daily. 03/08/20   Pricilla Loveless, MD      Allergies    Patient has no known allergies.    Review of Systems   Review of Systems  Genitourinary:  Positive for vaginal bleeding.    Physical Exam Updated Vital Signs BP (!) 133/93   Pulse 82   Temp 98.1 F (36.7 C)   Resp 16   Ht 5\' 2"  (1.575 m)   Wt 83.9 kg   LMP 09/04/2023   SpO2 100%   BMI 33.83 kg/m  Physical Exam Vitals and nursing note reviewed.  Constitutional:  General: She is not in acute distress.    Appearance: Normal appearance.  HENT:     Head: Normocephalic and atraumatic.     Nose: Nose normal.     Mouth/Throat:     Mouth: Mucous membranes are moist.     Pharynx: Oropharynx is clear.  Eyes:     Extraocular Movements: Extraocular movements intact.     Conjunctiva/sclera: Conjunctivae normal.  Cardiovascular:     Rate and Rhythm: Normal rate and regular rhythm.     Heart sounds: Normal heart sounds.  Pulmonary:     Effort: Pulmonary effort is normal.     Breath sounds: Normal breath sounds.  Abdominal:     General: Abdomen is flat.     Palpations: Abdomen is soft.     Tenderness: There is no abdominal tenderness.  Musculoskeletal:        General: Normal range of  motion.     Cervical back: Normal range of motion.  Skin:    General: Skin is warm and dry.  Neurological:     General: No focal deficit present.     Mental Status: She is alert and oriented to person, place, and time.  Psychiatric:        Mood and Affect: Mood normal.        Behavior: Behavior normal.     ED Results / Procedures / Treatments   Labs (all labs ordered are listed, but only abnormal results are displayed) Labs Reviewed  WET PREP, GENITAL - Abnormal; Notable for the following components:      Result Value   Clue Cells Wet Prep HPF POC PRESENT (*)    All other components within normal limits  CBC - Abnormal; Notable for the following components:   MCV 79.6 (*)    Platelets 411 (*)    All other components within normal limits  HCG, SERUM, QUALITATIVE  RPR  HIV ANTIBODY (ROUTINE TESTING W REFLEX)  GC/CHLAMYDIA PROBE AMP ([place]) NOT AT [facility]    EKG None  Radiology No results found.  Procedures Procedures    Medications Ordered in ED Medications  metroNIDAZOLE (FLAGYL) tablet 500 mg (has no administration in time range)    ED Course/ Medical Decision Making/ A&P Clinical Course as of 10/01/23 2046  Thu Oct 01, 2023  1910 Pelvic exam performed with Doretha Sou NT as chaperone. <10 cc of blood in the vault, no vaginal lacerations, blood appears to be coming from the cervix. No CMT or adnexal tenderness. [VK]  2017 Clue cells present, will be treated for BV. [VK]    Clinical Course User Index [VK] Rexford Maus, DO                                 Medical Decision Making This patient presents to the ED with chief complaint(s) of abnormal vaginal bleeding with no pertinent past medical history which further complicates the presenting complaint. The complaint involves an extensive differential diagnosis and also carries with it a high risk of complications and morbidity.    The differential diagnosis includes anemia, coagulopathy, vaginal  trauma, normal menses, pregnancy, ectopic  Additional history obtained: Additional history obtained from N/A Records reviewed N/A  ED Course and Reassessment: On patient's arrival she is hemodynamically stable in no acute distress.  Was initially evaluated in triage and had hCG which was negative and a CBC that showed normal hemoglobin and platelets.  Patient will  of pelvic exam to evaluate for significance of bleeding and possible source.  She also requested to be swabbed for STDs.  Independent labs interpretation:  The following labs were independently interpreted: Wet prep positive for BV, otherwise within normal range  Independent visualization of imaging: - N/A  Consultation: - Consulted or discussed management/test interpretation w/ external professional: N/A  Consideration for admission or further workup: Patient has no emergent conditions requiring admission or further work-up at this time and is stable for discharge home with primary care follow-up  Social Determinants of health: N/A    Amount and/or Complexity of Data Reviewed Labs: ordered.  Risk Prescription drug management.          Final Clinical Impression(s) / ED Diagnoses Final diagnoses:  BV (bacterial vaginosis)  Abnormal uterine bleeding (AUB)    Rx / DC Orders ED Discharge Orders          Ordered    metroNIDAZOLE (FLAGYL) 500 MG tablet  2 times daily        10/01/23 2044    megestrol (MEGACE) 40 MG tablet  Daily        10/01/23 2044              Rexford Maus, DO 10/01/23 2046

## 2023-10-01 NOTE — ED Notes (Addendum)
IV removed from [facility]

## 2023-10-02 LAB — GC/CHLAMYDIA PROBE AMP (~~LOC~~) NOT AT ARMC
Chlamydia: NEGATIVE
Comment: NEGATIVE
Comment: NORMAL
Neisseria Gonorrhea: NEGATIVE

## 2023-10-02 LAB — RPR: RPR Ser Ql: NONREACTIVE

## 2023-10-14 ENCOUNTER — Encounter (HOSPITAL_COMMUNITY): Payer: Self-pay

## 2023-10-14 ENCOUNTER — Other Ambulatory Visit: Payer: Self-pay

## 2023-10-14 ENCOUNTER — Emergency Department (HOSPITAL_COMMUNITY)
Admission: EM | Admit: 2023-10-14 | Discharge: 2023-10-14 | Disposition: A | Discharge status: Home / Self Care | Payer: BLUE CROSS/BLUE SHIELD | Attending: Emergency Medicine | Admitting: Emergency Medicine

## 2023-10-14 ENCOUNTER — Emergency Department (HOSPITAL_COMMUNITY): Payer: BLUE CROSS/BLUE SHIELD

## 2023-10-14 DIAGNOSIS — R112 Nausea with vomiting, unspecified: Secondary | ICD-10-CM

## 2023-10-14 DIAGNOSIS — J45909 Unspecified asthma, uncomplicated: Secondary | ICD-10-CM | POA: Diagnosis not present

## 2023-10-14 DIAGNOSIS — R1110 Vomiting, unspecified: Secondary | ICD-10-CM | POA: Diagnosis present

## 2023-10-14 DIAGNOSIS — Z7951 Long term (current) use of inhaled steroids: Secondary | ICD-10-CM | POA: Diagnosis not present

## 2023-10-14 DIAGNOSIS — F12988 Cannabis use, unspecified with other cannabis-induced disorder: Secondary | ICD-10-CM | POA: Insufficient documentation

## 2023-10-14 DIAGNOSIS — R1084 Generalized abdominal pain: Secondary | ICD-10-CM | POA: Insufficient documentation

## 2023-10-14 LAB — URINALYSIS, ROUTINE W REFLEX MICROSCOPIC
Bacteria, UA: NONE SEEN
Bilirubin Urine: NEGATIVE
Glucose, UA: NEGATIVE mg/dL
Ketones, ur: 5 mg/dL — AB
Leukocytes,Ua: NEGATIVE
Nitrite: NEGATIVE
Protein, ur: NEGATIVE mg/dL
Specific Gravity, Urine: >1.046 — ABNORMAL HIGH (ref 1.005–1.030)
pH: 9 — ABNORMAL HIGH (ref 5.0–8.0)

## 2023-10-14 LAB — CBC
HCT: 40.4 % (ref 36.0–46.0)
Hemoglobin: 14.6 g/dL (ref 12.0–15.0)
MCH: 28.5 pg (ref 26.0–34.0)
MCHC: 36.1 g/dL — ABNORMAL HIGH (ref 30.0–36.0)
MCV: 78.8 fL — ABNORMAL LOW (ref 80.0–100.0)
Platelets: 421 10*3/uL — ABNORMAL HIGH (ref 150–400)
RBC: 5.13 MIL/uL — ABNORMAL HIGH (ref 3.87–5.11)
RDW: 13.8 % (ref 11.5–15.5)
WBC: 9.3 10*3/uL (ref 4.0–10.5)
nRBC: 0 % (ref 0.0–0.2)

## 2023-10-14 LAB — COMPREHENSIVE METABOLIC PANEL
ALT: 22 U/L (ref 0–44)
AST: 24 U/L (ref 15–41)
Albumin: 4.6 g/dL (ref 3.5–5.0)
Alkaline Phosphatase: 27 U/L — ABNORMAL LOW (ref 38–126)
Anion gap: 15 (ref 5–15)
BUN: 11 mg/dL (ref 6–20)
CO2: 17 mmol/L — ABNORMAL LOW (ref 22–32)
Calcium: 9.6 mg/dL (ref 8.9–10.3)
Chloride: 106 mmol/L (ref 98–111)
Creatinine, Ser: 0.88 mg/dL (ref 0.44–1.00)
GFR, Estimated: >60 mL/min (ref >60)
Glucose, Bld: 134 mg/dL — ABNORMAL HIGH (ref 70–99)
Potassium: 3.8 mmol/L (ref 3.5–5.1)
Sodium: 138 mmol/L (ref 135–145)
Total Bilirubin: 1.1 mg/dL (ref <1.2)
Total Protein: 7.9 g/dL (ref 6.5–8.1)

## 2023-10-14 LAB — LIPASE, BLOOD: Lipase: 25 U/L (ref 11–51)

## 2023-10-14 LAB — RAPID URINE DRUG SCREEN, HOSP PERFORMED
Amphetamines: NOT DETECTED
Barbiturates: NOT DETECTED
Benzodiazepines: NOT DETECTED
Cocaine: NOT DETECTED
Opiates: POSITIVE — AB
Tetrahydrocannabinol: POSITIVE — AB

## 2023-10-14 LAB — HCG, SERUM, QUALITATIVE: Preg, Serum: NEGATIVE

## 2023-10-14 LAB — ETHANOL: Alcohol, Ethyl (B): <10 mg/dL (ref <10)

## 2023-10-14 MED ORDER — ONDANSETRON HCL 4 MG/2ML IJ SOLN
4.0000 mg | Freq: Once | INTRAMUSCULAR | Status: AC | PRN
  Administered 2023-10-14: 4 mg via INTRAVENOUS
  Filled 2023-10-14: qty 2

## 2023-10-14 MED ORDER — LACTATED RINGERS IV BOLUS
1000.0000 mL | Freq: Once | INTRAVENOUS | Status: AC
  Administered 2023-10-14: 1000 mL via INTRAVENOUS

## 2023-10-14 MED ORDER — IOHEXOL 300 MG/ML  SOLN
100.0000 mL | Freq: Once | INTRAMUSCULAR | Status: AC | PRN
  Administered 2023-10-14: 100 mL via INTRAVENOUS

## 2023-10-14 MED ORDER — MORPHINE SULFATE (PF) 2 MG/ML IV SOLN
2.0000 mg | Freq: Once | INTRAVENOUS | Status: AC
  Administered 2023-10-14: 2 mg via INTRAVENOUS
  Filled 2023-10-14: qty 1

## 2023-10-14 MED ORDER — ONDANSETRON 4 MG PO TBDP: 4.0000 mg | ORAL_TABLET | Freq: Three times a day (TID) | ORAL | 0 refills | Status: AC | PRN

## 2023-10-14 NOTE — Discharge Instructions (Addendum)
Relating to evaluate you today.  Your labs were all unremarkable.  Your CT was not remarkable for any acute abnormality.  This is likely due to recent cannabis use.  Please try to refrain from cannabis and excessive alcohol use.  I have provided you with a small Zofran prescription to your [place] pharmacy on [facility] for nausea and a referral for PCP if you need 1 above  Please return to emergency department if you experience worsening symptoms, abdominal pain, intractable vomiting

## 2023-10-14 NOTE — ED Triage Notes (Signed)
Pt arrived reporting vomiting and abdominal pain since last night after drinking. Denies any other symptoms.

## 2023-10-14 NOTE — ED Provider Notes (Signed)
[place] EMERGENCY DEPARTMENT AT [facility] Provider Note   CSN: [id number] Arrival date & time: 10/14/23  0750     History  Chief Complaint  Patient presents with   Abdominal Pain   Vomiting    Regina Lynn is a 22 y.o. female with a past medical history of GERD, asthma, polysubstance use presents emergency department for evaluation of vomiting and generalized abdominal pain.  Patient's father at bedside reports that she was intoxicated when she came home last evening at 9 PM.  She reports that she has cannabis use daily and has had similar symptoms of this in the past.  Last BM was yesterday and she has been passing flatulence.  She denies fevers, complaints prior to EtOH last evening   Abdominal Pain Associated symptoms: no chest pain, no chills, no constipation, no cough, no diarrhea, no fatigue, no fever, no nausea, no shortness of breath and no vomiting        Home Medications Prior to Admission medications   Medication Sig Start Date End Date Taking? Authorizing Provider  ondansetron (ZOFRAN-ODT) 4 MG disintegrating tablet Take 1 tablet (4 mg total) by mouth every 8 (eight) hours as needed for up to 5 days for nausea or vomiting. 10/14/23 10/19/23 Yes Judithann Sheen, PA  amoxicillin-clavulanate (AUGMENTIN) 875-125 MG tablet Take 1 tablet by mouth 2 (two) times daily. 12/08/22   Elson Areas, PA-C  doxycycline (VIBRAMYCIN) 100 MG capsule Take 1 capsule (100 mg total) by mouth 2 (two) times daily. One po bid x 7 days 05/27/23   Charlynne Pander, MD  etonogestrel (NEXPLANON) 68 MG IMPL implant 1 each by Subdermal route once.    [provider]  fluticasone (FLONASE) 50 MCG/ACT nasal spray Place 2 sprays into both nostrils daily. 07/12/22   Theron Arista, PA-C  ibuprofen (ADVIL) 200 MG tablet Take 200 mg by mouth every 6 (six) hours as needed for fever, headache or moderate pain. Patient not taking: Reported on 06/28/2020    [provider]   megestrol (MEGACE) 40 MG tablet Take 3 tablets (120 mg total) by mouth daily for 5 days, THEN 2 tablets (80 mg total) daily for 5 days, THEN 1 tablet (40 mg total) daily for 20 days. 10/01/23 10/31/23  Elayne Snare K, DO  metroNIDAZOLE (FLAGYL) 500 MG tablet Take 1 tablet (500 mg total) by mouth 2 (two) times daily. 10/01/23   Elayne Snare K, DO  ondansetron (ZOFRAN ODT) 4 MG disintegrating tablet Take 1 tablet (4 mg total) by mouth every 8 (eight) hours as needed for nausea or vomiting. Patient not taking: Reported on 06/28/2020 03/08/20   Pricilla Loveless, MD  oxyCODONE-acetaminophen (PERCOCET) 5-325 MG tablet Take 1 tablet by mouth every 6 (six) hours as needed. 05/27/23   Charlynne Pander, MD  pantoprazole (PROTONIX) 40 MG tablet Take 1 tablet (40 mg total) by mouth daily. 03/08/20   Pricilla Loveless, MD      Allergies    Patient has no known allergies.    Review of Systems   Review of Systems  Constitutional:  Negative for chills, fatigue and fever.  Respiratory:  Negative for cough, chest tightness, shortness of breath and wheezing.   Cardiovascular:  Negative for chest pain and palpitations.  Gastrointestinal:  Positive for abdominal pain. Negative for constipation, diarrhea, nausea and vomiting.  Neurological:  Negative for dizziness, seizures, weakness, light-headedness, numbness and headaches.    Physical Exam Updated Vital Signs BP 116/72   Pulse Marland Kitchen)  55   Temp 98 F (36.7 C) (Oral)   Resp 18   Ht 5\' 2"  (1.575 m)   Wt 83 kg   LMP 09/04/2023   SpO2 96%   BMI 33.47 kg/m  Physical Exam Vitals and nursing note reviewed.  Constitutional:      Appearance: Normal appearance. She is not ill-appearing.  HENT:     Head: Normocephalic and atraumatic.  Eyes:     Conjunctiva/sclera: Conjunctivae normal.  Cardiovascular:     Rate and Rhythm: Normal rate.  Pulmonary:     Effort: Pulmonary effort is normal. No respiratory distress.  Skin:    Coloration: Skin is not  jaundiced or pale.  Neurological:     Mental Status: She is alert. Mental status is at baseline.     ED Results / Procedures / Treatments   Labs (all labs ordered are listed, but only abnormal results are displayed) Labs Reviewed  COMPREHENSIVE METABOLIC PANEL - Abnormal; Notable for the following components:      Result Value   CO2 17 (*)    Glucose, Bld 134 (*)    Alkaline Phosphatase 27 (*)    All other components within normal limits  CBC - Abnormal; Notable for the following components:   RBC 5.13 (*)    MCV 78.8 (*)    MCHC 36.1 (*)    Platelets 421 (*)    All other components within normal limits  URINALYSIS, ROUTINE W REFLEX MICROSCOPIC - Abnormal; Notable for the following components:   Specific Gravity, Urine >1.046 (*)    pH 9.0 (*)    Hgb urine dipstick SMALL (*)    Ketones, ur 5 (*)    All other components within normal limits  RAPID URINE DRUG SCREEN, HOSP PERFORMED - Abnormal; Notable for the following components:   Opiates POSITIVE (*)    Tetrahydrocannabinol POSITIVE (*)    All other components within normal limits  LIPASE, BLOOD  HCG, SERUM, QUALITATIVE  ETHANOL    EKG None  Radiology CT ABDOMEN PELVIS W CONTRAST Result Date: 10/14/2023 CLINICAL DATA:  Abdominal pain, acute, nonlocalized. EXAM: CT ABDOMEN AND PELVIS WITH CONTRAST TECHNIQUE: Multidetector CT imaging of the abdomen and pelvis was performed using the standard protocol following bolus administration of intravenous contrast. RADIATION DOSE REDUCTION: This exam was performed according to the departmental dose-optimization program which includes automated exposure control, adjustment of the mA and/or kV according to patient size and/or use of iterative reconstruction technique. CONTRAST:  OMNIPAQUE IOHEXOL 300 MG/ML  SOLN COMPARISON:  CT scan abdomen and pelvis from 12/03/2017. FINDINGS: Lower chest: The lung bases are clear. No pleural effusion. The heart is normal in size. No pericardial  effusion. Hepatobiliary: The liver is normal in size. Non-cirrhotic configuration. These is diffuse hepatic steatosis. No suspicious mass. Note is made of 2, sub 5 mm, subcapsular hypoattenuating foci in the right hepatic lobe, segment 5, which are too small to adequately characterize. No intrahepatic or extrahepatic bile duct dilation. No calcified gallstones. Normal gallbladder wall thickness. No pericholecystic inflammatory changes. Pancreas: Unremarkable. No pancreatic ductal dilatation or surrounding inflammatory changes. Spleen: Within normal limits. No focal lesion. Adrenals/Urinary Tract: Adrenal glands are unremarkable. No suspicious renal mass. No hydronephrosis. No renal or ureteric calculi. Unremarkable urinary bladder. Stomach/Bowel: No disproportionate dilation of the small or large bowel loops. No evidence of abnormal bowel wall thickening or inflammatory changes. The appendix is unremarkable. Vascular/Lymphatic: There is trace amount of free fluid in the dependent pelvis, likely physiological in the  patient of this age group. No pneumoperitoneum. No abdominal or pelvic lymphadenopathy, by size criteria. No aneurysmal dilation of the major abdominal arteries. Reproductive: The uterus is unremarkable. The ovaries are unremarkable. Other: The visualized soft tissues and abdominal wall are unremarkable. Musculoskeletal: No suspicious osseous lesions. IMPRESSION: *No acute inflammatory process identified within the abdomen or pelvis. *Other nonacute observations, as described above. Electronically Signed   By: Jules Schick M.D.   On: 10/14/2023 11:26    Procedures Procedures    Medications Ordered in ED Medications  ondansetron (ZOFRAN) injection 4 mg (4 mg Intravenous Given 10/14/23 0820)  lactated ringers bolus 1,000 mL (0 mLs Intravenous Stopped 10/14/23 1034)  morphine (PF) 2 MG/ML injection 2 mg (2 mg Intravenous Given 10/14/23 0853)  morphine (PF) 2 MG/ML injection 2 mg (2 mg  Intravenous Given 10/14/23 1033)  iohexol (OMNIPAQUE) 300 MG/ML solution 100 mL (100 mLs Intravenous Contrast Given 10/14/23 0954)    ED Course/ Medical Decision Making/ A&P Clinical Course as of 10/14/23 1200  Wed Oct 14, 2023  1156 Pulse Rate(!): 25 Reassessed patient - No pleth wave. She is resting comfortably in bed and likely due to patient having long acrylic nails [LB]  1157 Appearance: CLEAR [LB]  1157 Specific Gravity, Urine(!): >1.046 Noninfectious urine and likely due to dehydration [LB]    Clinical Course User Index [LB] Judithann Sheen, PA                                 Medical Decision Making Amount and/or Complexity of Data Reviewed Labs: ordered. Radiology: ordered.  Risk Prescription drug management.   Patient presents to the ED for concern of generalized abd pain and vomiting, this involves an extensive number of treatment options, and is a complaint that carries with it a high risk of complications and morbidity.  The differential diagnosis includes gastroenteritis,   Co morbidities that complicate the patient evaluation  Daily ETOH and THC use   Additional history obtained:  Additional history obtained from [facility], Nursing, and Outside Medical Records   External records from outside source obtained and reviewed including 2 ED visits from 2021 and 2019 for cyclical vomiting and similar symptoms   Lab Tests:  I Ordered, and personally interpreted labs.  The pertinent results include:   UDS positive for THC   Imaging Studies ordered:  I ordered imaging studies including CT abdomen pelvis with contrast I independently visualized and interpreted imaging which showed no acute intra-abdominal abnormalities I agree with the radiologist interpretation    Medicines ordered and prescription drug management:  I ordered medication including zofran  for nausea  Reevaluation of the patient after these medicines showed that the patient improved I have  reviewed the patients home medicines and have made adjustments as needed    Problem List / ED Course:  Cannabinoid hyperemesis Initially patient was complaining of significant generalized abdominal pain and tenderness.  She was actively vomiting.  Analgesia, LR, and antiemetic provided significantly improving symptoms. CT negative for acute intra-abdominal abnormality Upon reassessment, patient is resting comfortably in bed.  She passes p.o. challenge Will provide short course of Zofran as needed for nausea Discussed cessation of marijuana and EtOH use Discussed return to emergency department precautions with patient expressed understanding agrees with plan.  All questions answered to her satisfaction.  She is agreeable to discharge at this time.   Reevaluation:  After the interventions noted above, I reevaluated the  patient and found that they have :resolved   Social Determinants of Health:  Has PCP follow-up   Dispostion:  After consideration of the diagnostic results and the patients response to treatment, I feel that the patent would benefit from outpatient management and symptomatic care.    Final Clinical Impression(s) / ED Diagnoses Final diagnoses:  Cannabinoid hyperemesis syndrome    Rx / DC Orders ED Discharge Orders          Ordered    ondansetron (ZOFRAN-ODT) 4 MG disintegrating tablet  Every 8 hours PRN        10/14/23 1151              Judithann Sheen, PA 10/14/23 1203    Bethann Berkshire, MD 10/14/23 1645

## 2024-01-09 ENCOUNTER — Other Ambulatory Visit: Payer: Self-pay

## 2024-01-09 ENCOUNTER — Encounter (HOSPITAL_COMMUNITY): Payer: Self-pay

## 2024-01-09 ENCOUNTER — Emergency Department (HOSPITAL_COMMUNITY)
Admission: EM | Admit: 2024-01-09 | Discharge: 2024-01-09 | Disposition: A | Discharge status: Home / Self Care | Attending: Emergency Medicine | Admitting: Emergency Medicine

## 2024-01-09 DIAGNOSIS — R112 Nausea with vomiting, unspecified: Secondary | ICD-10-CM | POA: Insufficient documentation

## 2024-01-09 DIAGNOSIS — J45909 Unspecified asthma, uncomplicated: Secondary | ICD-10-CM | POA: Insufficient documentation

## 2024-01-09 DIAGNOSIS — R109 Unspecified abdominal pain: Secondary | ICD-10-CM | POA: Diagnosis not present

## 2024-01-09 DIAGNOSIS — R1111 Vomiting without nausea: Secondary | ICD-10-CM

## 2024-01-09 LAB — COMPREHENSIVE METABOLIC PANEL
ALT: 22 U/L (ref 0–44)
AST: 22 U/L (ref 15–41)
Albumin: 4.3 g/dL (ref 3.5–5.0)
Alkaline Phosphatase: 27 U/L — ABNORMAL LOW (ref 38–126)
Anion gap: 11 (ref 5–15)
BUN: 7 mg/dL (ref 6–20)
CO2: 16 mmol/L — ABNORMAL LOW (ref 22–32)
Calcium: 9 mg/dL (ref 8.9–10.3)
Chloride: 110 mmol/L (ref 98–111)
Creatinine, Ser: 0.73 mg/dL (ref 0.44–1.00)
GFR, Estimated: >60 mL/min (ref >60)
Glucose, Bld: 150 mg/dL — ABNORMAL HIGH (ref 70–99)
Potassium: 3.6 mmol/L (ref 3.5–5.1)
Sodium: 137 mmol/L (ref 135–145)
Total Bilirubin: 1.3 mg/dL — ABNORMAL HIGH (ref 0.0–1.2)
Total Protein: 7.3 g/dL (ref 6.5–8.1)

## 2024-01-09 LAB — CBC WITH DIFFERENTIAL/PLATELET
Abs Immature Granulocytes: 0.04 10*3/uL (ref 0.00–0.07)
Basophils Absolute: 0 10*3/uL (ref 0.0–0.1)
Basophils Relative: 0 %
Eosinophils Absolute: 0 10*3/uL (ref 0.0–0.5)
Eosinophils Relative: 0 %
HCT: 36.8 % (ref 36.0–46.0)
Hemoglobin: 13.3 g/dL (ref 12.0–15.0)
Immature Granulocytes: 0 %
Lymphocytes Relative: 34 %
Lymphs Abs: 3.1 10*3/uL (ref 0.7–4.0)
MCH: 28.2 pg (ref 26.0–34.0)
MCHC: 36.1 g/dL — ABNORMAL HIGH (ref 30.0–36.0)
MCV: 78.1 fL — ABNORMAL LOW (ref 80.0–100.0)
Monocytes Absolute: 0.5 10*3/uL (ref 0.1–1.0)
Monocytes Relative: 5 %
Neutro Abs: 5.5 10*3/uL (ref 1.7–7.7)
Neutrophils Relative %: 61 %
Platelets: 369 10*3/uL (ref 150–400)
RBC: 4.71 MIL/uL (ref 3.87–5.11)
RDW: 14.1 % (ref 11.5–15.5)
WBC: 9.2 10*3/uL (ref 4.0–10.5)
nRBC: 0 % (ref 0.0–0.2)

## 2024-01-09 LAB — URINALYSIS, ROUTINE W REFLEX MICROSCOPIC
Bacteria, UA: NONE SEEN
Bilirubin Urine: NEGATIVE
Glucose, UA: NEGATIVE mg/dL
Ketones, ur: 20 mg/dL — AB
Leukocytes,Ua: NEGATIVE
Nitrite: NEGATIVE
Protein, ur: NEGATIVE mg/dL
Specific Gravity, Urine: 1.017 (ref 1.005–1.030)
pH: 8 (ref 5.0–8.0)

## 2024-01-09 LAB — LIPASE, BLOOD: Lipase: 25 U/L (ref 11–51)

## 2024-01-09 LAB — ETHANOL: Alcohol, Ethyl (B): <10 mg/dL (ref <10)

## 2024-01-09 LAB — HCG, SERUM, QUALITATIVE: Preg, Serum: NEGATIVE

## 2024-01-09 MED ORDER — DROPERIDOL 2.5 MG/ML IJ SOLN
1.2500 mg | Freq: Once | INTRAMUSCULAR | Status: AC
  Administered 2024-01-09: 1.25 mg via INTRAVENOUS
  Filled 2024-01-09: qty 2

## 2024-01-09 MED ORDER — ONDANSETRON 4 MG PO TBDP: 4.0000 mg | ORAL_TABLET | Freq: Three times a day (TID) | ORAL | 0 refills | Status: AC | PRN

## 2024-01-09 MED ORDER — ONDANSETRON HCL 4 MG/2ML IJ SOLN
4.0000 mg | Freq: Once | INTRAMUSCULAR | Status: AC
  Administered 2024-01-09: 4 mg via INTRAVENOUS
  Filled 2024-01-09: qty 2

## 2024-01-09 MED ORDER — LACTATED RINGERS IV BOLUS
2000.0000 mL | Freq: Once | INTRAVENOUS | Status: AC
  Administered 2024-01-09: 2000 mL via INTRAVENOUS

## 2024-01-09 MED ORDER — DIPHENHYDRAMINE HCL 50 MG/ML IJ SOLN
12.5000 mg | Freq: Once | INTRAMUSCULAR | Status: AC
  Administered 2024-01-09: 12.5 mg via INTRAVENOUS
  Filled 2024-01-09: qty 1

## 2024-01-09 NOTE — ED Provider Notes (Signed)
 [place] EMERGENCY DEPARTMENT AT [facility] Provider Note   CSN: [id number] Arrival date & time: 01/09/24  1208     History  Chief Complaint  Patient presents with   Emesis    Regina Lynn is a 23 y.o. female.  23 year old female presents today for concern of abdominal pain, nausea vomiting.  She states that last night she drank heavily along with smoking marijuana.  She states she smokes routinely and does not feel that that is related to her symptoms.  She states that this is due to the heavy drinking.  She states she is having some abdominal discomfort.  She states she had similar symptoms in the past when she drank heavily.  Denies history of abdominal surgeries.  No hematemesis.  The history is provided by the patient. No language interpreter was used.       Home Medications Prior to Admission medications   Medication Sig Start Date End Date Taking? Authorizing Provider  amoxicillin-clavulanate (AUGMENTIN) 875-125 MG tablet Take 1 tablet by mouth 2 (two) times daily. 12/08/22   Elson Areas, PA-C  doxycycline (VIBRAMYCIN) 100 MG capsule Take 1 capsule (100 mg total) by mouth 2 (two) times daily. One po bid x 7 days 05/27/23   Charlynne Pander, MD  etonogestrel (NEXPLANON) 68 MG IMPL implant 1 each by Subdermal route once.    [provider]  fluticasone (FLONASE) 50 MCG/ACT nasal spray Place 2 sprays into both nostrils daily. 07/12/22   Theron Arista, PA-C  ibuprofen (ADVIL) 200 MG tablet Take 200 mg by mouth every 6 (six) hours as needed for fever, headache or moderate pain. Patient not taking: Reported on 06/28/2020    [provider]  metroNIDAZOLE (FLAGYL) 500 MG tablet Take 1 tablet (500 mg total) by mouth 2 (two) times daily. 10/01/23   Elayne Snare K, DO  ondansetron (ZOFRAN ODT) 4 MG disintegrating tablet Take 1 tablet (4 mg total) by mouth every 8 (eight) hours as needed for nausea or vomiting. Patient not taking: Reported on  06/28/2020 03/08/20   Pricilla Loveless, MD  oxyCODONE-acetaminophen (PERCOCET) 5-325 MG tablet Take 1 tablet by mouth every 6 (six) hours as needed. 05/27/23   Charlynne Pander, MD  pantoprazole (PROTONIX) 40 MG tablet Take 1 tablet (40 mg total) by mouth daily. 03/08/20   Pricilla Loveless, MD      Allergies    Patient has no known allergies.    Review of Systems   Review of Systems  Constitutional:  Negative for chills and fever.  Gastrointestinal:  Positive for abdominal pain, nausea and vomiting.  Neurological:  Negative for light-headedness.  All other systems reviewed and are negative.   Physical Exam Updated Vital Signs BP (!) 134/114   Pulse 88   Temp 97.8 F (36.6 C)   Resp 20   Ht 5\' 2"  (1.575 m)   Wt 83 kg   SpO2 100%   BMI 33.47 kg/m  Physical Exam Vitals and nursing note reviewed.  Constitutional:      General: She is not in acute distress.    Appearance: Normal appearance. She is not ill-appearing.  HENT:     Head: Normocephalic and atraumatic.     Nose: Nose normal.  Eyes:     Conjunctiva/sclera: Conjunctivae normal.  Cardiovascular:     Rate and Rhythm: Normal rate and regular rhythm.  Pulmonary:     Effort: Pulmonary effort is normal. No respiratory distress.  Abdominal:     General:  There is no distension.     Palpations: Abdomen is soft.     Tenderness: There is abdominal tenderness (Mild tenderness on the right side of the abdomen). There is no guarding.  Musculoskeletal:        General: No deformity.  Skin:    Findings: No rash.  Neurological:     Mental Status: She is alert.     ED Results / Procedures / Treatments   Labs (all labs ordered are listed, but only abnormal results are displayed) Labs Reviewed  COMPREHENSIVE METABOLIC PANEL - Abnormal; Notable for the following components:      Result Value   CO2 16 (*)    Glucose, Bld 150 (*)    Alkaline Phosphatase 27 (*)    Total Bilirubin 1.3 (*)    All other components within normal  limits  CBC WITH DIFFERENTIAL/PLATELET - Abnormal; Notable for the following components:   MCV 78.1 (*)    MCHC 36.1 (*)    All other components within normal limits  LIPASE, BLOOD  HCG, SERUM, QUALITATIVE  ETHANOL  URINALYSIS, ROUTINE W REFLEX MICROSCOPIC    EKG None  Radiology No results found.  Procedures Procedures    Medications Ordered in ED Medications  ondansetron (ZOFRAN) injection 4 mg (4 mg Intravenous Given 01/09/24 1333)  lactated ringers bolus 2,000 mL (2,000 mLs Intravenous New Bag/Given 01/09/24 1342)  droperidol (INAPSINE) 2.5 MG/ML injection 1.25 mg (1.25 mg Intravenous Given 01/09/24 1335)  diphenhydrAMINE (BENADRYL) injection 12.5 mg (12.5 mg Intravenous Given 01/09/24 1335)    ED Course/ Medical Decision Making/ A&P                                 Medical Decision Making Amount and/or Complexity of Data Reviewed Labs: ordered.  Risk Prescription drug management.   Medical Decision Making / ED Course   This patient presents to the ED for concern of abdominal pain, nausea, vomiting, this involves an extensive number of treatment options, and is a complaint that carries with it a high risk of complications and morbidity.  The differential diagnosis includes cannabinoid induced hyperemesis syndrome, gastroenteritis, colitis, pancreatitis, diverticulitis, alcohol poisoning  MDM: 23 year old female presents today for above-mentioned symptoms.  Appears uncomfortable on exam.  She is hemodynamically stable.  Mild right-sided tenderness to palpation of the abdomen.  Will obtain labs, provide symptomatic management and reevaluate.  CBC without leukocytosis or anemia.  CMP with glucose of 150 otherwise without acute concern.  Minimally elevated T. bili not significant.  Ethanol level within normal, lipase within normal.  Pregnancy test negative.  UA without evidence of UTI.  On reevaluation patient reports significant improvement.  Given significant  improvement.  Do not feel imaging is warranted at this time.  She states her abdomen is also significantly improved.  She endorses only mild pain currently.  Fluid has barely gone and.  Will allow her to finish fluid infusion, will sign her out to oncoming team to reevaluate.  As long as she is improved she is stable for discharge.   Lab Tests: -I ordered, reviewed, and interpreted labs.   The pertinent results include:   Labs Reviewed  COMPREHENSIVE METABOLIC PANEL - Abnormal; Notable for the following components:      Result Value   CO2 16 (*)    Glucose, Bld 150 (*)    Alkaline Phosphatase 27 (*)    Total Bilirubin 1.3 (*)    All  other components within normal limits  CBC WITH DIFFERENTIAL/PLATELET - Abnormal; Notable for the following components:   MCV 78.1 (*)    MCHC 36.1 (*)    All other components within normal limits  LIPASE, BLOOD  HCG, SERUM, QUALITATIVE  ETHANOL  URINALYSIS, ROUTINE W REFLEX MICROSCOPIC      EKG  EKG Interpretation Date/Time:    Ventricular Rate:    PR Interval:    QRS Duration:    QT Interval:    QTC Calculation:   R Axis:      Text Interpretation:          Medicines ordered and prescription drug management: Meds ordered this encounter  Medications   ondansetron (ZOFRAN) injection 4 mg   lactated ringers bolus 2,000 mL   droperidol (INAPSINE) 2.5 MG/ML injection 1.25 mg   diphenhydrAMINE (BENADRYL) injection 12.5 mg    -I have reviewed the patients home medicines and have made adjustments as needed   Cardiac Monitoring: The patient was maintained on a cardiac monitor.  I personally viewed and interpreted the cardiac monitored which showed an underlying rhythm of: Normal sinus rhythm  Reevaluation: After the interventions noted above, I reevaluated the patient and found that they have :improved  Co morbidities that complicate the patient evaluation  Past Medical History:  Diagnosis Date   Acanthosis nigricans    Allergy     Asthma    Hypertrophy of tonsils and adenoids 11/2013   snores during sleep, mother denies apnea   Migraines    Obesity       Dispostion: Signout to oncoming provider to reevaluate.  As long as she remains improved she is stable for discharge.   Final Clinical Impression(s) / ED Diagnoses Final diagnoses:  Vomiting without nausea, unspecified vomiting type    Rx / DC Orders ED Discharge Orders          Ordered    ondansetron (ZOFRAN-ODT) 4 MG disintegrating tablet  Every 8 hours PRN        01/09/24 1451              Marita Kansas, PA-C 01/09/24 1458    Derwood Kaplan, MD 01/11/24 [phone]

## 2024-01-09 NOTE — ED Notes (Signed)
 Provided pt with ice water per PA

## 2024-01-09 NOTE — ED Notes (Signed)
 Pt said she can't give a urine specimen at the moment

## 2024-01-09 NOTE — Discharge Instructions (Addendum)
 Your workup was overall reassuring.  Your kidney, liver, and pancreas labs are normal.  Your electrolytes are normal.  Your pregnancy test is negative.   You have been prescribed Zofran which is a nausea medication.  You may take this up to every 8 hours as needed for nausea and vomiting at home.  Follow-up with your primary care provider.  Return for any severe abdominal pain, uncontrolled nausea vomiting, unexplained fevers, any other new concerning symptoms.

## 2024-01-09 NOTE — ED Triage Notes (Addendum)
 Pt BIB EMS from home. Pt was smoking weed and drinking 4 different types of alcohol last night. 20 g left ac 500 ml ns given.

## 2024-01-09 NOTE — ED Provider Notes (Signed)
 Abdominal pain and vomiting after drinking last night. Follow up on how she is feeling after medications.  Physical Exam  BP (!) 128/110   Pulse 70   Temp 97.8 F (36.6 C)   Resp 16   Ht 5\' 2"  (1.575 m)   Wt 83 kg   SpO2 95%   BMI 33.47 kg/m   Physical Exam Vitals and nursing note reviewed.  Constitutional:      Appearance: Normal appearance.     Comments: Well-appearing, no vomiting. Moving around the stretcher without difficulty  HENT:     Head: Atraumatic.  Cardiovascular:     Rate and Rhythm: Normal rate and regular rhythm.  Pulmonary:     Effort: Pulmonary effort is normal.  Abdominal:     Palpations: Abdomen is soft.     Tenderness: There is no abdominal tenderness.  Neurological:     General: No focal deficit present.     Mental Status: She is alert.  Psychiatric:        Mood and Affect: Mood normal.        Behavior: Behavior normal.     Procedures  Procedures  ED Course / MDM    Medical Decision Making Amount and/or Complexity of Data Reviewed Labs: ordered.  Risk Prescription drug management.     Patient received at signout.  Please see prior provider note for full details.  Patient, patient presenting with nausea vomiting and abdominal pain after drinking and smoking marijuana last night.  She feels like her symptoms are secondary to the drinking.  Her lab work is reassuring.  No elevation in lipase, LFTs, creatinine.  Electrolytes within normal limits.  Urinalysis without signs of infection.  Pregnancy negative. Abdomen soft nontender, low concern for acute abdominal pathology.    Upon reevaluation, patient states she is feeling much better after the benadryl, droperidol, and Zofran given earlier.  She was also given 2 L of LR.  She was asking for water when I went into the room, she feels up to eating and drinking. Her alcohol level is undetectable, she does not clinically appear intoxicated.  Patient's vital signs stable and she is appropriate for  discharge home.  Zofran was sent to her pharmacy.  Return precautions given.     Arabella Merles, PA-C 01/09/24 1530    Arby Barrette, MD 01/14/24 1040

## 2024-09-24 ENCOUNTER — Emergency Department (HOSPITAL_COMMUNITY): Admission: EM | Admit: 2024-09-24 | Discharge: 2024-09-24 | Disposition: A | Discharge status: Home / Self Care

## 2024-09-24 ENCOUNTER — Other Ambulatory Visit: Payer: Self-pay

## 2024-09-24 ENCOUNTER — Emergency Department (HOSPITAL_COMMUNITY)

## 2024-09-24 ENCOUNTER — Encounter (HOSPITAL_COMMUNITY): Payer: Self-pay

## 2024-09-24 DIAGNOSIS — W228XXA Striking against or struck by other objects, initial encounter: Secondary | ICD-10-CM | POA: Insufficient documentation

## 2024-09-24 DIAGNOSIS — S0990XA Unspecified injury of head, initial encounter: Secondary | ICD-10-CM | POA: Diagnosis present

## 2024-09-24 DIAGNOSIS — R519 Headache, unspecified: Secondary | ICD-10-CM

## 2024-09-24 LAB — HCG, SERUM, QUALITATIVE: Preg, Serum: NEGATIVE

## 2024-09-24 MED ORDER — METOCLOPRAMIDE HCL 5 MG/ML IJ SOLN
10.0000 mg | Freq: Once | INTRAMUSCULAR | Status: AC
Start: 2024-09-24 — End: 2024-09-24
  Administered 2024-09-24: 10 mg via INTRAMUSCULAR

## 2024-09-24 MED ORDER — METOCLOPRAMIDE HCL 5 MG/ML IJ SOLN
10.0000 mg | Freq: Once | INTRAMUSCULAR | Status: DC
  Filled 2024-09-24: qty 2

## 2024-09-24 MED ORDER — DIPHENHYDRAMINE HCL 50 MG/ML IJ SOLN
12.5000 mg | Freq: Once | INTRAMUSCULAR | Status: DC
  Filled 2024-09-24: qty 1

## 2024-09-24 MED ORDER — SODIUM CHLORIDE 0.9 % IV BOLUS: 1000.0000 mL | Freq: Once | INTRAVENOUS | Status: DC

## 2024-09-24 MED ORDER — DIPHENHYDRAMINE HCL 50 MG/ML IJ SOLN
12.5000 mg | Freq: Once | INTRAMUSCULAR | Status: AC
  Administered 2024-09-24: 12.5 mg via INTRAMUSCULAR

## 2024-09-24 MED ORDER — ONDANSETRON HCL 4 MG PO TABS: 4.0000 mg | ORAL_TABLET | Freq: Four times a day (QID) | ORAL | 0 refills | Status: AC

## 2024-09-24 NOTE — ED Notes (Addendum)
 NA

## 2024-09-24 NOTE — ED Provider Notes (Signed)
 [place] EMERGENCY DEPARTMENT AT [facility] Provider Note   CSN: [id number] Arrival date & time: 09/24/24  1525     Patient presents with: Head Injury  HPI Regina Lynn is a 23 y.o. female presenting for headache.  She states that 3 days ago she was leaning forward and hit a metal rail at work with her forehead.  States she had a large bruise in the center of her forehead.  The next day she woke up with a really bad headache.  The pain was localized around the forehead but did extend to the back of her head.  She also endorsing sensitivity to light and sound.  She reports that she has had a migraine in the past but this is slightly different.  Took Tylenol  and Aleve  yesterday but has not taken anything today.  She denies fever or nuchal rigidity.  Denies visual changes.    Head Injury      Prior to Admission medications   Medication Sig Start Date End Date Taking? Authorizing Provider  amoxicillin -clavulanate (AUGMENTIN ) 875-125 MG tablet Take 1 tablet by mouth 2 (two) times daily. Patient not taking: Reported on 01/09/2024 12/08/22   Sofia, Leslie K, PA-C  doxycycline  (VIBRAMYCIN ) 100 MG capsule Take 1 capsule (100 mg total) by mouth 2 (two) times daily. One po bid x 7 days Patient not taking: Reported on 01/09/2024 05/27/23   Patt Alm Macho, MD  etonogestrel (NEXPLANON) 68 MG IMPL implant 1 each by Subdermal route once.    [provider]  metroNIDAZOLE  (FLAGYL ) 500 MG tablet Take 1 tablet (500 mg total) by mouth 2 (two) times daily. Patient not taking: Reported on 01/09/2024 10/01/23   Kingsley, Victoria K, DO  ondansetron  (ZOFRAN -ODT) 4 MG disintegrating tablet Take 1 tablet (4 mg total) by mouth every 8 (eight) hours as needed. 01/09/24   Hildegard Loge, PA-C    Allergies: Patient has no known allergies.    Review of Systems See HPI  Updated Vital Signs BP (!) 150/118   Pulse 95   Temp 99.2 F (37.3 C) (Oral)   Resp 19   Ht 5' 2 (1.575 m)   Wt  79.4 kg   SpO2 100%   BMI 32.01 kg/m   Physical Exam Vitals and nursing note reviewed.  HENT:     Head: Normocephalic and atraumatic. No raccoon eyes or Battle's sign.      Mouth/Throat:     Mouth: Mucous membranes are moist.  Eyes:     General:        Right eye: No discharge.        Left eye: No discharge.     Conjunctiva/sclera: Conjunctivae normal.  Cardiovascular:     Rate and Rhythm: Normal rate and regular rhythm.     Pulses: Normal pulses.     Heart sounds: Normal heart sounds.  Pulmonary:     Effort: Pulmonary effort is normal.     Breath sounds: Normal breath sounds.  Abdominal:     General: Abdomen is flat.     Palpations: Abdomen is soft.  Skin:    General: Skin is warm and dry.  Neurological:     General: No focal deficit present.     Comments: GCS 15. Speech is goal oriented. No deficits appreciated to CN III-XII; symmetric eyebrow raise, no facial drooping, tongue midline. Patient has equal grip strength bilaterally with 5/5 strength against resistance in all major muscle groups bilaterally. Sensation to light touch intact. Patient moves extremities  without ataxia. Normal finger-nose-finger. Patient ambulatory with steady gait.   Psychiatric:        Mood and Affect: Mood normal.     (all labs ordered are listed, but only abnormal results are displayed) Labs Reviewed  HCG, SERUM, QUALITATIVE    EKG: None  Radiology: CT HEAD WO CONTRAST Result Date: 09/24/2024 CLINICAL DATA:  Headache. Patient hit her head a few days ago on a shopping cart. EXAM: CT HEAD WITHOUT CONTRAST TECHNIQUE: Contiguous axial images were obtained from the base of the skull through the vertex without intravenous contrast. RADIATION DOSE REDUCTION: This exam was performed according to the departmental dose-optimization program which includes automated exposure control, adjustment of the mA and/or kV according to patient size and/or use of iterative reconstruction technique. COMPARISON:   Brain MRI, 08/09/2014. FINDINGS: Brain: No evidence of acute infarction, hemorrhage, hydrocephalus, extra-axial collection or mass lesion/mass effect. Vascular: No hyperdense vessel or unexpected calcification. Skull: Normal. Negative for fracture or focal lesion. Sinuses/Orbits: Globes and orbits are unremarkable. Visualized sinuses are clear. Other: None. IMPRESSION: Normal unenhanced CT scan of the brain Electronically Signed   By: Alm Parkins M.D.   On: 09/24/2024 17:00     Procedures   Medications Ordered in the ED  metoCLOPramide  (REGLAN ) injection 10 mg (10 mg Intramuscular Given 09/24/24 1750)  diphenhydrAMINE  (BENADRYL ) injection 12.5 mg (12.5 mg Intramuscular Given 09/24/24 1750)                                    Medical Decision Making Amount and/or Complexity of Data Reviewed Labs: ordered. Radiology: ordered.  Risk Prescription drug management.   Initial Impression and Ddx 23 year old well-appearing female presenting for headache after head injury a couple days ago.  Exam is unremarkable.  DDx includes migraine, TBI, ICH, skull fracture, other. Patient PMH that increases complexity of ED encounter:  none  Interpretation of Diagnostics I independent reviewed and interpreted the labs as followed: neg beta hcg  - I independently visualized the following imaging with scope of interpretation limited to determining acute life threatening conditions related to emergency care: CT head, which revealed no acute findings  Patient Reassessment and Ultimate Disposition/Management On reassessment headache had improved considerably.  Suspect this is likely a migraine and/or mild concussion from head injury she sustained a few days ago.  Otherwise she looks well with no neurodeficits with resolving headache here.  Feel at this time she is safe for discharge and recommended PCP follow-up.  Discussed return precautions.  Discharged good condition.  Patient management required  discussion with the following services or consulting groups:  None  Complexity of Problems Addressed Acute complicated illness or Injury  Additional Data Reviewed and Analyzed Further history obtained from: Past medical history and medications listed in the EMR and Prior ED visit notes  Patient Encounter Risk Assessment Consideration of hospitalization      Final diagnoses:  Nonintractable headache, unspecified chronicity pattern, unspecified headache type  Injury of head, initial encounter    ED Discharge Orders     None          Lang Norleen POUR, PA-C 09/24/24 1832    Simon Lavonia SAILOR, MD 09/24/24 2224

## 2024-09-24 NOTE — Discharge Instructions (Addendum)
 Evaluation today was overall reassuring.  Suspect you may have a migraine and possibly a mild concussion.  Treatment is supportive at home which includes rest and assertive hydration.  If you become nauseous to start vomiting also sent Zofran  to your pharmacy.  Also recommend Tylenol  ibuprofen  and caffeine for your headaches.  If your symptoms worsen in any way please return to ED for further evaluation.  Otherwise recommend follow-up with your PCP.

## 2024-09-24 NOTE — ED Triage Notes (Signed)
 Patient hit her head a few days ago on a shopping cart on her forehead and the bridge of her nose. Has a migraine. Took tylenol , aleve .

## 2024-10-17 ENCOUNTER — Encounter (HOSPITAL_COMMUNITY): Payer: Self-pay

## 2024-10-17 ENCOUNTER — Other Ambulatory Visit: Payer: Self-pay

## 2024-10-17 ENCOUNTER — Emergency Department (HOSPITAL_COMMUNITY)
Admission: EM | Admit: 2024-10-17 | Discharge: 2024-10-17 | Disposition: A | Discharge status: Home / Self Care | Attending: Emergency Medicine | Admitting: Emergency Medicine

## 2024-10-17 DIAGNOSIS — J101 Influenza due to other identified influenza virus with other respiratory manifestations: Secondary | ICD-10-CM | POA: Diagnosis not present

## 2024-10-17 DIAGNOSIS — R0789 Other chest pain: Secondary | ICD-10-CM | POA: Diagnosis present

## 2024-10-17 LAB — RESP PANEL BY RT-PCR (RSV, FLU A&B, COVID)  RVPGX2
Influenza A by PCR: POSITIVE — AB
Influenza B by PCR: NEGATIVE
Resp Syncytial Virus by PCR: NEGATIVE
SARS Coronavirus 2 by RT PCR: NEGATIVE

## 2024-10-17 NOTE — ED Triage Notes (Signed)
 Pt presents to the ED with c/o of migraine lasting the past 4 weeks following a concussion. Chest pain that started 3 days ago when pt first started to feel sick with a cough that started 3 days ago. Denies n/v/d.

## 2024-10-17 NOTE — Discharge Instructions (Signed)
 You are seen in the emergency department today for concerns of a headache and chest discomfort.  You were positive for influenza.  This is likely causing your current symptoms.  Make sure that you are staying hydrated, using Tylenol , and ibuprofen  to help with your symptoms.  For any concerns of new or worsening symptoms, return to the emergency department.  Otherwise, be careful around any high risk individuals at home over the next 3 days as you are likely contagious and will continue to spread this for several more days.

## 2024-10-17 NOTE — ED Provider Notes (Signed)
 " [place] EMERGENCY DEPARTMENT AT [facility] Provider Note   CSN: [id number] Arrival date & time: 10/17/24  1406     Patient presents with: No chief complaint on file.   Regina Lynn is a 23 y.o. female.  Patient presents to the emergency department today with concerns of migraine and chest tightness.  Reports that she has been experiencing migraine for about 4 weeks since a concussion.  States that she developed some chest discomfort about 3 days ago and started feel sick with a productive cough at the same time.  No nausea, vomiting, or diarrhea.  Denies any fevers as far she can tell at home.  Has been taking over-the-counter medications for her headache and cough with only slight improvement in symptoms.  No sick contacts as far she is aware.  HPI     Prior to Admission medications  Medication Sig Start Date End Date Taking? Authorizing Provider  amoxicillin -clavulanate (AUGMENTIN ) 875-125 MG tablet Take 1 tablet by mouth 2 (two) times daily. Patient not taking: Reported on 01/09/2024 12/08/22   Sofia, Leslie K, PA-C  doxycycline  (VIBRAMYCIN ) 100 MG capsule Take 1 capsule (100 mg total) by mouth 2 (two) times daily. One po bid x 7 days Patient not taking: Reported on 01/09/2024 05/27/23   Patt Alm Macho, MD  etonogestrel (NEXPLANON) 68 MG IMPL implant 1 each by Subdermal route once.    [provider]  metroNIDAZOLE  (FLAGYL ) 500 MG tablet Take 1 tablet (500 mg total) by mouth 2 (two) times daily. Patient not taking: Reported on 01/09/2024 10/01/23   Kingsley, Victoria K, DO  ondansetron  (ZOFRAN ) 4 MG tablet Take 1 tablet (4 mg total) by mouth every 6 (six) hours. 09/24/24   Robinson, John K, PA-C  ondansetron  (ZOFRAN -ODT) 4 MG disintegrating tablet Take 1 tablet (4 mg total) by mouth every 8 (eight) hours as needed. 01/09/24   Hildegard Loge, PA-C    Allergies: Patient has no known allergies.    Review of Systems  Respiratory:  Positive for cough.    Neurological:  Positive for headaches.  All other systems reviewed and are negative.   Updated Vital Signs BP 124/84 (BP Location: Right Arm)   Pulse 72   Temp 98.8 F (37.1 C) (Oral)   Resp 18   SpO2 100%   Physical Exam Vitals and nursing note reviewed.  Constitutional:      General: She is not in acute distress.    Appearance: She is well-developed.  HENT:     Head: Normocephalic and atraumatic.  Eyes:     Conjunctiva/sclera: Conjunctivae normal.  Cardiovascular:     Rate and Rhythm: Normal rate and regular rhythm.     Heart sounds: No murmur heard. Pulmonary:     Effort: Pulmonary effort is normal. No respiratory distress.     Breath sounds: Normal breath sounds. No wheezing, rhonchi or rales.  Abdominal:     Palpations: Abdomen is soft.     Tenderness: There is no abdominal tenderness.  Musculoskeletal:        General: No swelling.     Cervical back: Neck supple.  Skin:    General: Skin is warm and dry.     Capillary Refill: Capillary refill takes less than 2 seconds.  Neurological:     Mental Status: She is alert.  Psychiatric:        Mood and Affect: Mood normal.     (all labs ordered are listed, but only abnormal results are displayed) Labs  Reviewed  RESP PANEL BY RT-PCR (RSV, FLU A&B, COVID)  RVPGX2 - Abnormal; Notable for the following components:      Result Value   Influenza A by PCR POSITIVE (*)    All other components within normal limits    EKG: None  Radiology: No results found.   Procedures   Medications Ordered in the ED - No data to display                                  Medical Decision Making  This patient presents to the ED for concern of cough, headache.  Differential diagnosis includes COVID-19, flu Enza, pneumonia, bronchitis, concussion, migraine    Additional history obtained:  Additional history obtained from Chart review   Lab Tests:  I Ordered, and personally interpreted labs.  The pertinent results  include: Respiratory panel positive for influenza A    Problem List / ED Course:  Patient presents to the emergency department today for concerns of headache and chest tightness and cough.  States headache has been ongoing for the last month and worsened last 3 days when she began to feel unwell with a productive cough.  No chest pain or shortness of breath.  States that she suffered a concussion about 1 month back and has been try to manage symptoms at home.  No reported visual loss or change, severe headache, nausea, vomiting, or weakness.  No sick contacts. Physical exam is unremarkable.  No abnormal heart or lung sounds.  EOMs intact.  Pupils are equal and reactive. Patient with viral panel obtained in triage due to concerns of cough and feeling unwell.  Positive for influenza A.  Suspect that this is likely causing worsening of her headache given her recent migraine. Patient otherwise well-appearing at this time and given that she has had symptoms ongoing for about 3 days, advised unlikely and will benefit from starting antiviral therapy.  Patient agreeable this plan.  She is otherwise stable at this time for outpatient follow-up and discharged home.  Return precautions advised.   Social Determinants of Health:  None  Final diagnoses:  Influenza A    ED Discharge Orders     None          Cecily Legrand DELENA DEVONNA 10/17/24 2037    Elnor Savant A, DO 10/24/24 1544  "
